# Patient Record
Sex: Male | Born: 1970 | Race: White | Hispanic: No | Marital: Married | Smoking: Former smoker
Health system: Southern US, Community
[De-identification: ages and names within clinical notes are randomized; demographics above are authoritative.]

## PROBLEM LIST (undated history)

## (undated) DIAGNOSIS — S83281A Other tear of lateral meniscus, current injury, right knee, initial encounter: Secondary | ICD-10-CM

## (undated) DIAGNOSIS — Z9289 Personal history of other medical treatment: Secondary | ICD-10-CM

## (undated) DIAGNOSIS — E785 Hyperlipidemia, unspecified: Secondary | ICD-10-CM

## (undated) DIAGNOSIS — G4730 Sleep apnea, unspecified: Secondary | ICD-10-CM

## (undated) DIAGNOSIS — M2341 Loose body in knee, right knee: Secondary | ICD-10-CM

## (undated) HISTORY — DX: Personal history of other medical treatment: Z92.89

## (undated) HISTORY — DX: Hyperlipidemia, unspecified: E78.5

## (undated) HISTORY — PX: NASAL SINUS SURGERY: SHX719

## (undated) HISTORY — PX: LUMBAR DISC SURGERY: SHX700

---

## 1995-08-09 HISTORY — PX: LUMBAR DISC SURGERY: SHX700

## 2001-02-27 ENCOUNTER — Ambulatory Visit (HOSPITAL_COMMUNITY): Admission: RE | Admit: 2001-02-27 | Discharge: 2001-02-27 | Payer: Self-pay | Admitting: Gastroenterology

## 2001-07-16 ENCOUNTER — Emergency Department (HOSPITAL_COMMUNITY): Admission: EM | Admit: 2001-07-16 | Discharge: 2001-07-16 | Payer: Self-pay | Admitting: Emergency Medicine

## 2001-07-16 ENCOUNTER — Encounter: Payer: Self-pay | Admitting: Emergency Medicine

## 2001-07-19 ENCOUNTER — Encounter: Payer: Self-pay | Admitting: Internal Medicine

## 2001-07-19 ENCOUNTER — Encounter: Admission: RE | Admit: 2001-07-19 | Discharge: 2001-07-19 | Payer: Self-pay | Admitting: Internal Medicine

## 2003-04-15 ENCOUNTER — Encounter: Payer: Self-pay | Admitting: *Deleted

## 2003-04-15 ENCOUNTER — Encounter: Admission: RE | Admit: 2003-04-15 | Discharge: 2003-04-15 | Payer: Self-pay | Admitting: *Deleted

## 2003-05-10 ENCOUNTER — Emergency Department (HOSPITAL_COMMUNITY): Admission: EM | Admit: 2003-05-10 | Discharge: 2003-05-11 | Payer: Self-pay | Admitting: Emergency Medicine

## 2005-04-27 ENCOUNTER — Emergency Department (HOSPITAL_COMMUNITY): Admission: EM | Admit: 2005-04-27 | Discharge: 2005-04-27 | Payer: Self-pay | Admitting: Emergency Medicine

## 2006-05-26 ENCOUNTER — Ambulatory Visit (HOSPITAL_BASED_OUTPATIENT_CLINIC_OR_DEPARTMENT_OTHER): Admission: RE | Admit: 2006-05-26 | Discharge: 2006-05-26 | Payer: Self-pay | Admitting: Orthopedic Surgery

## 2006-05-26 HISTORY — PX: KNEE ARTHROSCOPY: SUR90

## 2009-08-08 DIAGNOSIS — R9431 Abnormal electrocardiogram [ECG] [EKG]: Secondary | ICD-10-CM

## 2009-08-08 HISTORY — DX: Abnormal electrocardiogram (ECG) (EKG): R94.31

## 2009-08-28 ENCOUNTER — Encounter: Payer: Self-pay | Admitting: Emergency Medicine

## 2009-08-28 ENCOUNTER — Observation Stay (HOSPITAL_COMMUNITY): Admission: EM | Admit: 2009-08-28 | Discharge: 2009-08-29 | Payer: Self-pay | Admitting: Internal Medicine

## 2009-08-28 ENCOUNTER — Ambulatory Visit: Payer: Self-pay | Admitting: Diagnostic Radiology

## 2009-09-02 ENCOUNTER — Encounter: Admission: RE | Admit: 2009-09-02 | Discharge: 2009-09-02 | Payer: Self-pay | Admitting: Gastroenterology

## 2009-09-04 ENCOUNTER — Encounter: Admission: RE | Admit: 2009-09-04 | Discharge: 2009-09-04 | Payer: Self-pay | Admitting: Gastroenterology

## 2009-12-11 HISTORY — PX: LAPAROSCOPIC CHOLECYSTECTOMY: SUR755

## 2010-02-05 DIAGNOSIS — Z9289 Personal history of other medical treatment: Secondary | ICD-10-CM

## 2010-02-05 HISTORY — DX: Personal history of other medical treatment: Z92.89

## 2010-08-29 ENCOUNTER — Encounter: Payer: Self-pay | Admitting: Gastroenterology

## 2010-10-24 LAB — LIPID PANEL
Cholesterol: 193 mg/dL (ref 0–200)
HDL: 48 mg/dL (ref >39)
LDL Cholesterol: 125 mg/dL — ABNORMAL HIGH (ref 0–99)
Total CHOL/HDL Ratio: 4 RATIO
Triglycerides: 102 mg/dL (ref <150)
VLDL: 20 mg/dL (ref 0–40)

## 2010-10-24 LAB — CARDIAC PANEL(CRET KIN+CKTOT+MB+TROPI)
CK, MB: 1.4 ng/mL (ref 0.3–4.0)
CK, MB: 1.9 ng/mL (ref 0.3–4.0)
Relative Index: 1 (ref 0.0–2.5)
Relative Index: 1.1 (ref 0.0–2.5)
Relative Index: 1.1 (ref 0.0–2.5)
Total CK: 133 U/L (ref 7–232)
Total CK: 146 U/L (ref 7–232)
Total CK: 179 U/L (ref 7–232)
Troponin I: 0.02 ng/mL (ref 0.00–0.06)
Troponin I: 0.02 ng/mL (ref 0.00–0.06)
Troponin I: 0.02 ng/mL (ref 0.00–0.06)

## 2010-10-24 LAB — HEMOGLOBIN A1C
Hgb A1c MFr Bld: 5.6 % (ref 4.6–6.1)
Mean Plasma Glucose: 114 mg/dL

## 2010-10-24 LAB — CBC
Platelets: 219 10*3/uL (ref 150–400)
WBC: 10.3 10*3/uL (ref 4.0–10.5)

## 2010-10-24 LAB — BASIC METABOLIC PANEL
BUN: 13 mg/dL (ref 6–23)
Chloride: 105 mEq/L (ref 96–112)
Potassium: 3.4 mEq/L — ABNORMAL LOW (ref 3.5–5.1)

## 2010-10-24 LAB — TSH: TSH: 0.652 u[IU]/mL (ref 0.350–4.500)

## 2010-10-25 LAB — POCT CARDIAC MARKERS
CKMB, poc: 1.3 ng/mL (ref 1.0–8.0)
Myoglobin, poc: 57.7 ng/mL (ref 12–200)

## 2010-10-25 LAB — DIFFERENTIAL
Basophils Absolute: 0.1 10*3/uL (ref 0.0–0.1)
Lymphocytes Relative: 23 % (ref 12–46)
Monocytes Absolute: 0.7 10*3/uL (ref 0.1–1.0)
Monocytes Relative: 8 % (ref 3–12)
Neutro Abs: 6.1 10*3/uL (ref 1.7–7.7)
Neutrophils Relative %: 68 % (ref 43–77)

## 2010-10-25 LAB — POCT TOXICOLOGY PANEL

## 2010-10-25 LAB — CBC
Hemoglobin: 16.1 g/dL (ref 13.0–17.0)
RBC: 5.46 MIL/uL (ref 4.22–5.81)
RDW: 13.1 % (ref 11.5–15.5)

## 2010-10-25 LAB — BASIC METABOLIC PANEL
Calcium: 9.4 mg/dL (ref 8.4–10.5)
GFR calc Af Amer: >60 mL/min (ref >60)
GFR calc non Af Amer: >60 mL/min (ref >60)
Sodium: 143 mEq/L (ref 135–145)

## 2010-12-24 NOTE — Op Note (Signed)
NAMEVONDELL, Philip Dorsey              ACCOUNT NO.:  [id number]   MEDICAL RECORD NO.:  [id number]          PATIENT TYPE:  AMB   LOCATION:  [facility]                         FACILITY:  [facility]   PHYSICIAN:  Marlowe Kays, M.D.  DATE OF BIRTH:  [birth year]   DATE OF PROCEDURE:  05/26/2006  DATE OF DISCHARGE:  05/26/2006                                 OPERATIVE REPORT   PREOPERATIVE DIAGNOSIS:  Torn medial meniscus left knee.   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   OPERATION:  Left knee arthroscopy with excision of medial shelf plica and  debridement of medial facet of patella.   SURGEON:  Marlowe Kays, M.D.   ASSISTANT:  None.   ANESTHESIA:  General.   PATHOLOGY AND INDICATIONS FOR PROCEDURE:  This man has had a many-year  history of pain the inner aspect of his left knee.  An MRI in 2005 had  demonstrated what appeared to be a posterior horn tear of the medial  meniscus.  This was equivocal.  No other abnormalities were noted.  He has  noted progressive pain and popping in the knee and difficulty getting up  from a sitting position when playing with his daughter.  This has prompted  his surgery here today.  See operative description below for additional  details.   PROCEDURE:  Under satisfactory general anesthesia and pneumatic tourniquet,  the left leg was esmarched out nonsterilely.  Ace wrap and knee support to  right leg.  Thigh stabilizer applied to the left leg, which was prepped from  stabilizer to ankle with DuraPrep and draped into sterile field.  Superomedial saline inflow.  First through an anterolateral portal, the  medial compartment of the knee joint was evaluated and looked absolutely  normal.  There was no unusual synovitis, no chondromalacia, and I got a good  look at the entire medial meniscus with representative pictures being taken.  No abnormality could be noted on probing.  I then looked up in  the medial  gutter and suprapatellar area.  He had a large medial shelf plica which was  causing some irritation of the medial femoral condyle but particularly the  medial facet of the patella with some wear present.  This was in contrast to  the remainder of the patella which looked completely normal.  I was able to  completely resect the plica with a 3.5 shaver including some smoothing down  of the medial femoral condyle medially and the medial facet of the patella.  Pre and post films were taken.  I then reversed the  portals.  The lateral  compartment of the knee joint was completely normal once again.  Representative picture was taken.  The knee joint was then irrigated until  clear and all fluid possible removed.  The two anterior portals were closed  with 4-0 nylon.  I injected 20 mL of 0.5% Marcaine with adrenaline and 4 mg  of morphine into the knee through the inflow apparatus which was removed and  this portal  closed with 4-0 nylon as well.  Betadine  and Adaptic dry sterile dressing  were applied.  Tourniquet was released.  He tolerated the procedure well and  was taken to the recovery room in satisfactory condition with no known  complications.           ______________________________  Marlowe Kays, M.D.     JA/MEDQ  D:  05/26/2006  T:  05/28/2006  Job:  [id number]

## 2010-12-24 NOTE — Procedures (Signed)
[facility]  Patient:    Philip Dorsey, Philip Dorsey                       MRN: [id number] Proc. Date: 02/27/01 Attending:  Verlin Grills, M.D.                           Procedure Report  PROCEDURE PERFORMED:  Esophagogastroduodenoscopy.  DATE OF BIRTH:  May 08, 1971  ENDOSCOPIST:  Verlin Grills, M.D.  INDICATIONS FOR PROCEDURE:  The patient is a 40 year old male.  Philip Dorsey has had both daytime and nocturnal heartburn unassociated with dysphagia, odynophagia or gastrointestinal bleeding for approximately two months.  His heartburn intensified about the time he quit smoking cigarettes and chewing tobacco.  Despite double dose Nexium and then a trial of prevacid, his heartburn persisted.  He takes no other medications.  He is otherwise in good health.  He tells me he did undergo a cardiac evaluation following an episode of intense chest pain and was told his heart was normal.  CURRENT MEDICATIONS:  Prevacid.  MEDICATION ALLERGIES:  None.  PAST MEDICAL HISTORY:  Back surgery in 1997.  PREMEDICATION:  Versed 10 mg, Demerol 50 mg.  INSTRUMENT USED:  Olympus gastroscope.  DESCRIPTION OF PROCEDURE:  After obtaining informed consent, Philip Dorsey was placed in a left lateral decubitus position.  I administered intravenous Versed and intravenous Demerol to achieve conscious sedation for the procedure.  The patients blood pressure, oxygen saturation and cardiac rhythm were monitored throughout the procedure and documented in the medical record.   The Olympus gastroscope was passed through the posterior hypopharynx into the proximal esophagus without difficulty.  The hypopharynx, larynx and vocal cords appeared normal.  Esophagoscopy:  The proximal, mid and lower segments of the esophagus appeared normal.  The squamocolumnar junction and the esophagogastric junction were noted at approximately 45 cm from the incisor teeth.  Endoscopically,  there is no evidence for the presence of Barretts esophagus, esophageal mucosal scarring, erosive esophagitis or esophageal ulceration.  Gastroscopy:  A retroflex view of the gastric cardia and fundus was normal. The gastric body, antrum and pylorus appeared normal.  Duodenoscopy:  The duodenal bulb and descending duodenum appeared normal.  ASSESSMENT:  Normal esophagogastroduodenoscopy. DD:  02/27/01 TD:  02/27/01 Job: [id number]

## 2011-11-30 ENCOUNTER — Encounter (HOSPITAL_BASED_OUTPATIENT_CLINIC_OR_DEPARTMENT_OTHER): Payer: Self-pay | Admitting: *Deleted

## 2011-11-30 NOTE — Progress Notes (Signed)
NPO AFTER MN WITH EXCEPTION WATER/ GATORADE UNTIL 0700. ARRIVES AT 1100. NEEDS HG. 

## 2011-12-01 ENCOUNTER — Other Ambulatory Visit: Payer: Self-pay | Admitting: Orthopedic Surgery

## 2011-12-02 ENCOUNTER — Encounter (HOSPITAL_BASED_OUTPATIENT_CLINIC_OR_DEPARTMENT_OTHER): Payer: Self-pay | Admitting: *Deleted

## 2011-12-02 ENCOUNTER — Encounter (HOSPITAL_BASED_OUTPATIENT_CLINIC_OR_DEPARTMENT_OTHER): Payer: Self-pay | Admitting: Anesthesiology

## 2011-12-02 ENCOUNTER — Encounter (HOSPITAL_BASED_OUTPATIENT_CLINIC_OR_DEPARTMENT_OTHER)
Admission: RE | Disposition: A | Discharge status: Home / Self Care | Payer: Self-pay | Source: Ambulatory Visit | Attending: Orthopedic Surgery

## 2011-12-02 ENCOUNTER — Ambulatory Visit (HOSPITAL_BASED_OUTPATIENT_CLINIC_OR_DEPARTMENT_OTHER)
Admission: RE | Admit: 2011-12-02 | Discharge: 2011-12-02 | Disposition: A | Discharge status: Home / Self Care | Payer: 59 | Source: Ambulatory Visit | Attending: Orthopedic Surgery | Admitting: Orthopedic Surgery

## 2011-12-02 ENCOUNTER — Ambulatory Visit (HOSPITAL_BASED_OUTPATIENT_CLINIC_OR_DEPARTMENT_OTHER): Payer: 59 | Admitting: Anesthesiology

## 2011-12-02 DIAGNOSIS — M2240 Chondromalacia patellae, unspecified knee: Secondary | ICD-10-CM | POA: Insufficient documentation

## 2011-12-02 DIAGNOSIS — Z9889 Other specified postprocedural states: Secondary | ICD-10-CM

## 2011-12-02 DIAGNOSIS — M238X9 Other internal derangements of unspecified knee: Secondary | ICD-10-CM | POA: Insufficient documentation

## 2011-12-02 HISTORY — DX: Loose body in knee, right knee: M23.41

## 2011-12-02 HISTORY — PX: KNEE ARTHROSCOPY: SHX127

## 2011-12-02 LAB — POCT HEMOGLOBIN-HEMACUE: Hemoglobin: 14.5 g/dL (ref 13.0–17.0)

## 2011-12-02 SURGERY — ARTHROSCOPY, KNEE
Anesthesia: General | Site: Knee | Laterality: Right | Wound class: Clean

## 2011-12-02 MED ORDER — FENTANYL CITRATE 0.05 MG/ML IJ SOLN
25.0000 ug | INTRAMUSCULAR | Status: DC | PRN
  Administered 2011-12-02 (×2): 50 ug via INTRAVENOUS

## 2011-12-02 MED ORDER — SODIUM CHLORIDE 0.9 % IR SOLN
Status: DC | PRN
  Administered 2011-12-02: 9000 mL

## 2011-12-02 MED ORDER — OXYCODONE HCL 5 MG PO TABS
2.5000 mg | ORAL_TABLET | Freq: Once | ORAL | Status: AC
  Administered 2011-12-02: 5 mg via ORAL

## 2011-12-02 MED ORDER — POVIDONE-IODINE 7.5 % EX SOLN: Freq: Once | CUTANEOUS | Status: DC

## 2011-12-02 MED ORDER — ONDANSETRON HCL 4 MG/2ML IJ SOLN
INTRAMUSCULAR | Status: DC | PRN
  Administered 2011-12-02: 4 mg via INTRAVENOUS

## 2011-12-02 MED ORDER — OXYCODONE-ACETAMINOPHEN 5-325 MG PO TABS
1.0000 | ORAL_TABLET | Freq: Once | ORAL | Status: AC
  Administered 2011-12-02: 1 via ORAL

## 2011-12-02 MED ORDER — EPINEPHRINE HCL 1 MG/ML IJ SOLN
INTRAMUSCULAR | Status: DC | PRN
  Administered 2011-12-02: 2 mg

## 2011-12-02 MED ORDER — BUPIVACAINE-EPINEPHRINE 0.5% -1:200000 IJ SOLN
INTRAMUSCULAR | Status: DC | PRN
  Administered 2011-12-02: 25 mL

## 2011-12-02 MED ORDER — MIDAZOLAM HCL 5 MG/5ML IJ SOLN
INTRAMUSCULAR | Status: DC | PRN
  Administered 2011-12-02: 2 mg via INTRAVENOUS

## 2011-12-02 MED ORDER — DEXAMETHASONE SODIUM PHOSPHATE 4 MG/ML IJ SOLN
INTRAMUSCULAR | Status: DC | PRN
  Administered 2011-12-02: 8 mg via INTRAVENOUS

## 2011-12-02 MED ORDER — METHOCARBAMOL 500 MG PO TABS: 500.0000 mg | ORAL_TABLET | Freq: Four times a day (QID) | ORAL | Status: AC

## 2011-12-02 MED ORDER — PROPOFOL 10 MG/ML IV EMUL
INTRAVENOUS | Status: DC | PRN
  Administered 2011-12-02: 200 mg via INTRAVENOUS

## 2011-12-02 MED ORDER — MORPHINE SULFATE 4 MG/ML IJ SOLN
INTRAMUSCULAR | Status: DC | PRN
  Administered 2011-12-02: 4 mg via INTRAVENOUS

## 2011-12-02 MED ORDER — LACTATED RINGERS IV SOLN
INTRAVENOUS | Status: DC
  Administered 2011-12-02 (×2): via INTRAVENOUS

## 2011-12-02 MED ORDER — LACTATED RINGERS IV SOLN: INTRAVENOUS | Status: DC

## 2011-12-02 MED ORDER — LIDOCAINE HCL (CARDIAC) 20 MG/ML IV SOLN
INTRAVENOUS | Status: DC | PRN
  Administered 2011-12-02: 60 mg via INTRAVENOUS

## 2011-12-02 MED ORDER — PROMETHAZINE HCL 25 MG/ML IJ SOLN: 6.2500 mg | INTRAMUSCULAR | Status: DC | PRN

## 2011-12-02 MED ORDER — OXYCODONE-ACETAMINOPHEN 7.5-325 MG PO TABS: 1.0000 | ORAL_TABLET | ORAL | Status: AC | PRN

## 2011-12-02 MED ORDER — FENTANYL CITRATE 0.05 MG/ML IJ SOLN
INTRAMUSCULAR | Status: DC | PRN
  Administered 2011-12-02: 50 ug via INTRAVENOUS
  Administered 2011-12-02: 25 ug via INTRAVENOUS
  Administered 2011-12-02: 50 ug via INTRAVENOUS
  Administered 2011-12-02: 25 ug via INTRAVENOUS
  Administered 2011-12-02: 50 ug via INTRAVENOUS

## 2011-12-02 SURGICAL SUPPLY — 47 items
BANDAGE ELASTIC 6 VELCRO ST LF (GAUZE/BANDAGES/DRESSINGS) ×2 IMPLANT
BANDAGE ESMARK 6X9 LF (GAUZE/BANDAGES/DRESSINGS) ×1 IMPLANT
BANDAGE GAUZE ELAST BULKY 4 IN (GAUZE/BANDAGES/DRESSINGS) ×2 IMPLANT
BLADE 4.2CUDA (BLADE) IMPLANT
BLADE CUDA 5.5 (BLADE) IMPLANT
BLADE CUDA SHAVER 3.5 (BLADE) ×2 IMPLANT
BLADE CUTTER GATOR 3.5 (BLADE) IMPLANT
BLADE GREAT WHITE 4.2 (BLADE) IMPLANT
BNDG ESMARK 6X9 LF (GAUZE/BANDAGES/DRESSINGS) ×2
CANISTER SUCT LVC 12 LTR MEDI- (MISCELLANEOUS) ×4 IMPLANT
CANISTER SUCTION 1200CC (MISCELLANEOUS) ×2 IMPLANT
CLOTH BEACON ORANGE TIMEOUT ST (SAFETY) ×2 IMPLANT
DRAPE ARTHROSCOPY W/POUCH 114 (DRAPES) ×2 IMPLANT
DRAPE LG THREE QUARTER DISP (DRAPES) ×2 IMPLANT
DRSG EMULSION OIL 3X3 NADH (GAUZE/BANDAGES/DRESSINGS) ×2 IMPLANT
DRSG PAD ABDOMINAL 8X10 ST (GAUZE/BANDAGES/DRESSINGS) ×2 IMPLANT
DURAPREP 26ML APPLICATOR (WOUND CARE) ×2 IMPLANT
ELECT MENISCUS 165MM 90D (ELECTRODE) IMPLANT
ELECT REM PT RETURN 9FT ADLT (ELECTROSURGICAL)
ELECTRODE REM PT RTRN 9FT ADLT (ELECTROSURGICAL) IMPLANT
GLOVE BIOGEL PI IND STRL 8 (GLOVE) ×1 IMPLANT
GLOVE BIOGEL PI INDICATOR 8 (GLOVE) ×1
GLOVE ECLIPSE 8.0 STRL XLNG CF (GLOVE) ×4 IMPLANT
GLOVE INDICATOR 8.0 STRL GRN (GLOVE) ×4 IMPLANT
GOWN PREVENTION PLUS LG XLONG (DISPOSABLE) ×2 IMPLANT
GOWN STRL REIN XL XLG (GOWN DISPOSABLE) ×2 IMPLANT
IV NS IRRIG 3000ML ARTHROMATIC (IV SOLUTION) ×4 IMPLANT
KNEE WRAP E Z 3 GEL PACK (MISCELLANEOUS) ×2 IMPLANT
NDL SAFETY ECLIPSE 18X1.5 (NEEDLE) ×1 IMPLANT
NEEDLE HYPO 18GX1.5 BLUNT FILL (NEEDLE) ×2 IMPLANT
NEEDLE HYPO 18GX1.5 SHARP (NEEDLE) ×1
PACK ARTHROSCOPY DSU (CUSTOM PROCEDURE TRAY) ×2 IMPLANT
PACK BASIN DAY SURGERY FS (CUSTOM PROCEDURE TRAY) ×2 IMPLANT
PADDING CAST ABS 4INX4YD NS (CAST SUPPLIES) ×1
PADDING CAST ABS COTTON 4X4 ST (CAST SUPPLIES) ×1 IMPLANT
PENCIL BUTTON HOLSTER BLD 10FT (ELECTRODE) IMPLANT
SET ARTHROSCOPY TUBING (MISCELLANEOUS) ×2
SET ARTHROSCOPY TUBING LN (MISCELLANEOUS) ×2 IMPLANT
SPONGE GAUZE 4X4 12PLY (GAUZE/BANDAGES/DRESSINGS) ×2 IMPLANT
SUT ETHIBOND 2 OS 4 DA (SUTURE) IMPLANT
SUT ETHILON 4 0 PS 2 18 (SUTURE) ×2 IMPLANT
SUT VIC AB 0 CT1 36 (SUTURE) IMPLANT
SUT VIC AB 2-0 PS2 27 (SUTURE) IMPLANT
SYRINGE 10CC LL (SYRINGE) ×2 IMPLANT
TOWEL OR 17X24 6PK STRL BLUE (TOWEL DISPOSABLE) ×2 IMPLANT
WAND 90 DEG TURBOVAC W/CORD (SURGICAL WAND) IMPLANT
WATER STERILE IRR 500ML POUR (IV SOLUTION) ×2 IMPLANT

## 2011-12-02 NOTE — Anesthesia Postprocedure Evaluation (Signed)
  Anesthesia Post-op Note  Patient: Philip Dorsey  Procedure(s) Performed: Procedure(s) (LRB): ARTHROSCOPY KNEE (Right)  Patient Location: PACU  Anesthesia Type: General  Level of Consciousness: awake and alert   Airway and Oxygen Therapy: Patient Spontanous Breathing  Post-op Pain: mild  Post-op Assessment: Post-op Vital signs reviewed, Patient's Cardiovascular Status Stable, Respiratory Function Stable, Patent Airway and No signs of Nausea or vomiting  Post-op Vital Signs: stable  Complications: No apparent anesthesia complications

## 2011-12-02 NOTE — Discharge Instructions (Signed)
  Post Anesthesia Home Care Instructions  Activity: Get plenty of rest for the remainder of the day. A responsible adult should stay with you for 24 hours following the procedure.  For the next 24 hours, DO NOT: -Drive a car -Operate machinery -Drink alcoholic beverages -Take any medication unless instructed by your physician -Make any legal decisions or sign important papers.  Meals: Start with liquid foods such as gelatin or soup. Progress to regular foods as tolerated. Avoid greasy, spicy, heavy foods. If nausea and/or vomiting occur, drink only clear liquids until the nausea and/or vomiting subsides. Call your physician if vomiting continues.  Special Instructions/Symptoms: Your throat may feel dry or sore from the anesthesia or the breathing tube placed in your throat during surgery. If this causes discomfort, gargle with warm salt water. The discomfort should disappear within 24 hours.  Discharge Instructions After Orthopedic Procedures:  *You may feel tired and weak following your procedure. It is recommended that you limit physical activity for the next 24 hours and rest at home for the remainder of today and tomorrow. *No strenuous activity should be started without your doctor's permission.  Elevate the extremity that you had surgery on to a level above your heart. This should continue for 48 hours or as instructed by your doctor.  If you had hand, arm or shoulder surgery you should move your fingers frequently unless otherwise instructed by your doctor.  If you had foot, knee or leg surgery you should wiggle your toes frequently unless otherwise instructed by your doctor.  Follow your doctor's exact instructions for activity at home. Use your home equipment as instructed. (Crutches, hard shoes, slings etc.)  Limit your activity as instructed by your doctor.  Report to your doctor should any of the following occur: 1. Extreme swelling of your fingers or toes. 2. Inability  to wiggle your fingers or toes. 3. Coldness, pale or bluish color in your fingers or toes. 4. Loss of sensation, numbness or tingling of your fingers or toes. 5. Unusual smell or odor from under your dressing or cast. 6. Excessive bleeding or drainage from the surgical site. 7. Pain not relieved by medication your doctor has prescribed for you. 8. Cast or dressing too tight (do not get your dressing or cast wet or put anything under          your dressing or cast.)  *Do not change your dressing unless instructed by your doctor or discharge nurse. Then follow exact instructions.  *Follow labeled instructions for any medications that your doctor may have prescribed for you. *Should any questions or complications develop following your procedure, PLEASE CONTACT YOUR DOCTOR.      

## 2011-12-02 NOTE — Anesthesia Preprocedure Evaluation (Addendum)
Anesthesia Evaluation  Patient identified by MRN, date of birth, ID band Patient awake    Reviewed: Allergy & Precautions, H&P , NPO status , Patient's Chart, lab work & pertinent test results  Airway Mallampati: I TM Distance: >3 FB Neck ROM: Full    Dental  (+) Teeth Intact, Dental Advisory Given and Caps,    Pulmonary neg pulmonary ROS,  breath sounds clear to auscultation  Pulmonary exam normal       Cardiovascular negative cardio ROS  Rhythm:Regular Rate:Normal     Neuro/Psych negative neurological ROS  negative psych ROS   GI/Hepatic negative GI ROS, Neg liver ROS,   Endo/Other  negative endocrine ROS  Renal/GU negative Renal ROS  negative genitourinary   Musculoskeletal negative musculoskeletal ROS (+)   Abdominal   Peds  Hematology negative hematology ROS (+)   Anesthesia Other Findings   Reproductive/Obstetrics negative OB ROS                          Anesthesia Physical Anesthesia Plan  ASA: I  Anesthesia Plan: General   Post-op Pain Management:    Induction: Intravenous  Airway Management Planned: LMA  Additional Equipment:   Intra-op Plan:   Post-operative Plan: Extubation in OR  Informed Consent: I have reviewed the patients History and Physical, chart, labs and discussed the procedure including the risks, benefits and alternatives for the proposed anesthesia with the patient or authorized representative who has indicated his/her understanding and acceptance.   Dental advisory given  Plan Discussed with: CRNA  Anesthesia Plan Comments:         Anesthesia Quick Evaluation

## 2011-12-02 NOTE — Brief Op Note (Signed)
12/02/2011  3:09 PM  PATIENT:  Philip Dorsey  41 y.o. 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  PROCEDURE:  Procedure(s) (LRB): ARTHROSCOPY KNEE (Right)wit debridement medial facet patella; synovectomy anterior medial joint  SURGEON:  Surgeon(s) and Role:    * Drucilla Schmidt, MD - Primary  PHYSICIAN ASSISTANT:    ASSISTANTS:nurse ANESTHESIA:   general  EBL:  Total I/O In: 1300 [I.V.:1300] Out: -   BLOOD ADMINISTERED:none  DRAINS: none   LOCAL MEDICATIONS USED:  MARCAINE     SPECIMEN:  No Specimen  DISPOSITION OF SPECIMEN:  N/A  COUNTS:  YES  TOURNIQUET:  * Missing tourniquet times found for documented tourniquets in log:  [id number] *  DICTATION: .Other Dictation: Dictation Number [id number]  PLAN OF CARE: Discharge to home after PACU  PATIENT DISPOSITION:  PACU - hemodynamically stable.   Delay start of Pharmacological VTE agent (>24hrs) due to surgical blood loss or risk of bleeding: not applicable

## 2011-12-02 NOTE — Anesthesia Procedure Notes (Signed)
Procedure Name: LMA Insertion Date/Time: 12/02/2011 1:35 PM Performed by: Renella Cunas D Pre-anesthesia Checklist: Patient identified, Emergency Drugs available, Suction available and Patient being monitored Patient Re-evaluated:Patient Re-evaluated prior to inductionOxygen Delivery Method: Circle System Utilized Preoxygenation: Pre-oxygenation with 100% oxygen Intubation Type: IV induction Ventilation: Mask ventilation without difficulty LMA: LMA inserted LMA Size: 4.0 Number of attempts: 1 Airway Equipment and Method: bite block Placement Confirmation: positive ETCO2 Tube secured with: Tape Dental Injury: Teeth and Oropharynx as per pre-operative assessment

## 2011-12-02 NOTE — Transfer of Care (Signed)
Immediate Anesthesia Transfer of Care Note  Patient: Philip Dorsey  Procedure(s) Performed: Procedure(s) (LRB): ARTHROSCOPY KNEE (Right)  Patient Location: PACU  Anesthesia Type: General  Level of Consciousness: awake, oriented, sedated and patient cooperative  Airway & Oxygen Therapy: Patient Spontanous Breathing and Patient connected to face mask oxygen  Post-op Assessment: Report given to PACU RN and Post -op Vital signs reviewed and stable  Post vital signs: Reviewed and stable  Complications: No apparent anesthesia complications

## 2011-12-02 NOTE — H&P (Signed)
Philip Dorsey is an 41 y.o. male.   Chief Complaint: painful rt knee NWG:NFAOZHY of successful lt knee arthroscopy;now rt knee pain with loose body on xray  Past Medical History  Diagnosis Date  . Loose body of right knee     Past Surgical History  Procedure Date  . Knee arthroscopy 05-26-2006    LEFT  . Lumbar disc surgery 1997  . Laparoscopic cholecystectomy 2010    History reviewed. No pertinent family history. Social History:  reports that he has never smoked. He has never used smokeless tobacco. He reports that he drinks alcohol. He reports that he does not use illicit drugs.  Allergies: No Known Allergies  No prescriptions prior to admission    No results found for this or any previous visit (from the past 48 hour(s)). No results found.  ROS  Blood pressure 119/72, pulse 61, temperature 97.6 F (36.4 C), temperature source Oral, resp. rate 20, height 5\' 11"  (1.803 m), weight 90.719 kg (200 lb), SpO2 98.00%. Physical Exam  Constitutional: He is oriented to person, place, and time. He appears well-developed and well-nourished.  HENT:  Head: Normocephalic and atraumatic.  Right Ear: External ear normal.  Left Ear: External ear normal.  Nose: Nose normal.  Mouth/Throat: Oropharynx is clear and moist.  Eyes: Conjunctivae and EOM are normal. Pupils are equal, round, and reactive to light.  Neck: Normal range of motion. Neck supple.  Cardiovascular: Normal rate, regular rhythm, normal heart sounds and intact distal pulses.   Respiratory: Effort normal and breath sounds normal.  GI: Soft. Bowel sounds are normal.  Musculoskeletal: Normal range of motion.  Neurological: He is alert and oriented to person, place, and time. He has normal reflexes.  Skin: Skin is warm and dry.  Psychiatric: He has a normal mood and affect. His behavior is normal. Judgment and thought content normal.     Assessment/Plan Painful rt knee with loose body Rt knee arthroscopy with removal  loose body  Philip Dorsey P 12/02/2011, 1:21 PM

## 2011-12-04 NOTE — Op Note (Signed)
NAMEPELHAM, HENNICK              ACCOUNT NO.:  [id number]  MEDICAL RECORD NO.:  [id number]  LOCATION:                               FACILITY:  [facility]  PHYSICIAN:  Marlowe Kays, M.D.  DATE OF BIRTH:  1971/02/23  DATE OF PROCEDURE: DATE OF DISCHARGE:                              OPERATIVE REPORT   PREOPERATIVE DIAGNOSIS:  Painful right knee with suspected loose body.  POSTOPERATIVE DIAGNOSES: 1. Chondromalacia of medial facet of patella. 2. 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  OPERATION: 1. Right knee arthroscopy with 1 debridement of medial facet of     patella. 2. Debridement of synovium, anterior medial joint.  SURGEON:  Marlowe Kays, M.D.  ASSISTANT:  Nurse.  ANESTHESIA:  General.  PATHOLOGY AND JUSTIFICATION FOR PROCEDURE:  Painful right knee with what appeared to be a loose body in the anterior joint on plain x-rays.  He has had previous left knee surgery with the plica and with excision of it, did well.  I did not get an MRI of his right knee because of the anticipated pathology and what appeared to be a loose body on plain x- rays.  PROCEDURE IN DETAIL:  Satisfied general anesthesia, Ace wrap, and knee support to the left lower extremity, pneumatic tourniquet applied to the right lower extremity and with the leg Esmarched out nonsterilely, the tourniquet inflated to 300 mmHg.  Superior medial saline inflow.  First, an anterolateral portal, medial compartment joint was evaluated, found a very thickened synovium in the anterior medial joint, which was clearly abnormal and associated anatomically with wearing of the medial femoral condyle articular cartilage.  I felt__________ this could have accounted for a small fragment of tissue that could be seen on x-ray.  I debrided down the anterior joint and the process cleaned off the meniscus.  The synovium appeared to be adherent to the  meniscus.  The remainder of the meniscus was normal on probing except for a very minimal area of fraying _____at_____ about the junction of the mid and posterior 3rd, which I felt that I would create more alteration to the tissue by trying to shave this out since it did not appear to be a harmful type of fraying. Looking at the medial gutter and suprapatellar area, I did not find the medial shelf plica, but did find significant wear along with large strands of articular cartilage coming off the medial facet of the patella, which I shaved down roughly 3/4 chondromalacia present.  On reversing portals, his ACL looked normal, it has minimal synovial projections in the anterior 3rd of the lateral joint, which I pictured and resected.  The remainder of the joint looking normal.  I _then searched_________ the joint for any fragments and found none.  Knee joint was then irrigated till clear.  I closed the 2 anterior portals with 4-0 nylon and injected through the superior medial portal, 20 cc of 0.25% Marcaine and 4 mg of morphine. Then, closed this portal with 4-0 nylon as well.  Betadine, Adaptic, dry sterile dressing were applied.  Tourniquet was released.  She tolerated the procedure well, was taken  to recovery room in satisfied condition with no known complications.          ______________________________ Marlowe Kays, M.D.     JA/MEDQ  D:  12/02/2011  T:  12/03/2011  Job:  [id number]

## 2011-12-05 ENCOUNTER — Encounter (HOSPITAL_BASED_OUTPATIENT_CLINIC_OR_DEPARTMENT_OTHER): Payer: Self-pay | Admitting: Orthopedic Surgery

## 2011-12-08 ENCOUNTER — Encounter (HOSPITAL_BASED_OUTPATIENT_CLINIC_OR_DEPARTMENT_OTHER): Payer: Self-pay

## 2013-06-18 ENCOUNTER — Encounter: Payer: Self-pay | Admitting: Internal Medicine

## 2013-06-18 ENCOUNTER — Encounter: Payer: Self-pay | Admitting: *Deleted

## 2013-06-19 ENCOUNTER — Ambulatory Visit: Payer: 59 | Admitting: Internal Medicine

## 2014-02-18 DIAGNOSIS — K219 Gastro-esophageal reflux disease without esophagitis: Secondary | ICD-10-CM | POA: Insufficient documentation

## 2014-02-18 DIAGNOSIS — K5790 Diverticulosis of intestine, part unspecified, without perforation or abscess without bleeding: Secondary | ICD-10-CM | POA: Insufficient documentation

## 2015-08-04 ENCOUNTER — Other Ambulatory Visit: Payer: Self-pay | Admitting: Otolaryngology

## 2015-08-04 DIAGNOSIS — H9190 Unspecified hearing loss, unspecified ear: Secondary | ICD-10-CM

## 2015-08-04 DIAGNOSIS — H9312 Tinnitus, left ear: Secondary | ICD-10-CM

## 2015-08-04 DIAGNOSIS — H6980 Other specified disorders of Eustachian tube, unspecified ear: Secondary | ICD-10-CM

## 2015-08-06 ENCOUNTER — Ambulatory Visit
Admission: RE | Admit: 2015-08-06 | Discharge: 2015-08-06 | Disposition: A | Discharge status: Home / Self Care | Payer: BLUE CROSS/BLUE SHIELD | Source: Ambulatory Visit | Attending: Otolaryngology | Admitting: Otolaryngology

## 2015-08-06 DIAGNOSIS — H9312 Tinnitus, left ear: Secondary | ICD-10-CM

## 2015-08-06 DIAGNOSIS — H9190 Unspecified hearing loss, unspecified ear: Secondary | ICD-10-CM

## 2015-08-06 DIAGNOSIS — H6980 Other specified disorders of Eustachian tube, unspecified ear: Secondary | ICD-10-CM

## 2015-08-06 MED ORDER — GADOBENATE DIMEGLUMINE 529 MG/ML IV SOLN
18.0000 mL | Freq: Once | INTRAVENOUS | Status: AC | PRN
  Administered 2015-08-06: 18 mL via INTRAVENOUS

## 2015-08-21 ENCOUNTER — Other Ambulatory Visit: Payer: Self-pay | Admitting: Otolaryngology

## 2016-08-08 HISTORY — PX: BACK SURGERY: SHX140

## 2016-10-26 DIAGNOSIS — M5136 Other intervertebral disc degeneration, lumbar region: Secondary | ICD-10-CM | POA: Insufficient documentation

## 2016-10-26 DIAGNOSIS — M545 Low back pain, unspecified: Secondary | ICD-10-CM | POA: Insufficient documentation

## 2016-10-26 DIAGNOSIS — M5137 Other intervertebral disc degeneration, lumbosacral region: Secondary | ICD-10-CM | POA: Insufficient documentation

## 2016-11-30 DIAGNOSIS — Z9889 Other specified postprocedural states: Secondary | ICD-10-CM | POA: Insufficient documentation

## 2018-06-02 ENCOUNTER — Encounter: Payer: Self-pay | Admitting: Podiatry

## 2018-06-02 ENCOUNTER — Ambulatory Visit (INDEPENDENT_AMBULATORY_CARE_PROVIDER_SITE_OTHER): Payer: Commercial Managed Care - PPO

## 2018-06-02 ENCOUNTER — Ambulatory Visit: Payer: Commercial Managed Care - PPO | Admitting: Podiatry

## 2018-06-02 VITALS — BP 130/87 | HR 62

## 2018-06-02 DIAGNOSIS — M7661 Achilles tendinitis, right leg: Secondary | ICD-10-CM

## 2018-06-02 DIAGNOSIS — M76822 Posterior tibial tendinitis, left leg: Secondary | ICD-10-CM

## 2018-06-02 DIAGNOSIS — M7752 Other enthesopathy of left foot: Secondary | ICD-10-CM | POA: Diagnosis not present

## 2018-06-02 DIAGNOSIS — M659 Unspecified synovitis and tenosynovitis, unspecified site: Secondary | ICD-10-CM

## 2018-06-02 DIAGNOSIS — M7671 Peroneal tendinitis, right leg: Secondary | ICD-10-CM

## 2018-06-02 DIAGNOSIS — M7751 Other enthesopathy of right foot: Secondary | ICD-10-CM

## 2018-06-02 DIAGNOSIS — M7750 Other enthesopathy of unspecified foot: Secondary | ICD-10-CM

## 2018-06-02 MED ORDER — METHYLPREDNISOLONE 4 MG PO TBPK: ORAL_TABLET | ORAL | 0 refills | Status: DC

## 2018-06-02 NOTE — Patient Instructions (Signed)
Achilles Tendinitis  with Rehab Achilles tendinitis is a disorder of the Achilles tendon. The Achilles tendon connects the large calf muscles (Gastrocnemius and Soleus) to the heel bone (calcaneus). This tendon is sometimes called the heel cord. It is important for pushing-off and standing on your toes and is important for walking, running, or jumping. Tendinitis is often caused by overuse and repetitive microtrauma. SYMPTOMS  Pain, tenderness, swelling, warmth, and redness may occur over the Achilles tendon even at rest.  Pain with pushing off, or flexing or extending the ankle.  Pain that is worsened after or during activity. CAUSES   Overuse sometimes seen with rapid increase in exercise programs or in sports requiring running and jumping.  Poor physical conditioning (strength and flexibility or endurance).  Running sports, especially training running down hills.  Inadequate warm-up before practice or play or failure to stretch before participation.  Injury to the tendon. PREVENTION   Warm up and stretch before practice or competition.  Allow time for adequate rest and recovery between practices and competition.  Keep up conditioning.  Keep up ankle and leg flexibility.  Improve or keep muscle strength and endurance.  Improve cardiovascular fitness.  Use proper technique.  Use proper equipment (shoes, skates).  To help prevent recurrence, taping, protective strapping, or an adhesive bandage may be recommended for several weeks after healing is complete. PROGNOSIS   Recovery may take weeks to several months to heal.  Longer recovery is expected if symptoms have been prolonged.  Recovery is usually quicker if the inflammation is due to a direct blow as compared with overuse or sudden strain. RELATED COMPLICATIONS   Healing time will be prolonged if the condition is not correctly treated. The injury must be given plenty of time to heal.  Symptoms can reoccur if  activity is resumed too soon.  Untreated, tendinitis may increase the risk of tendon rupture requiring additional time for recovery and possibly surgery. TREATMENT   The first treatment consists of rest anti-inflammatory medication, and ice to relieve the pain.  Stretching and strengthening exercises after resolution of pain will likely help reduce the risk of recurrence. Referral to a physical therapist or athletic trainer for further evaluation and treatment may be helpful.  A walking boot or cast may be recommended to rest the Achilles tendon. This can help break the cycle of inflammation and microtrauma.  Arch supports (orthotics) may be prescribed or recommended by your caregiver as an adjunct to therapy and rest.  Surgery to remove the inflamed tendon lining or degenerated tendon tissue is rarely necessary and has shown less than predictable results. MEDICATION   Nonsteroidal anti-inflammatory medications, such as aspirin and ibuprofen, may be used for pain and inflammation relief. Do not take within 7 days before surgery. Take these as directed by your caregiver. Contact your caregiver immediately if any bleeding, stomach upset, or signs of allergic reaction occur. Other minor pain relievers, such as acetaminophen, may also be used.  Pain relievers may be prescribed as necessary by your caregiver. Do not take prescription pain medication for longer than 4 to 7 days. Use only as directed and only as much as you need.  Cortisone injections are rarely indicated. Cortisone injections may weaken tendons and predispose to rupture. It is better to give the condition more time to heal than to use them. HEAT AND COLD  Cold is used to relieve pain and reduce inflammation for acute and chronic Achilles tendinitis. Cold should be applied for 10 to 15 minutes   every 2 to 3 hours for inflammation and pain and immediately after any activity that aggravates your symptoms. Use ice packs or an ice  massage.  Heat may be used before performing stretching and strengthening activities prescribed by your caregiver. Use a heat pack or a warm soak. SEEK MEDICAL CARE IF:  Symptoms get worse or do not improve in 2 weeks despite treatment.  New, unexplained symptoms develop. Drugs used in treatment may produce side effects.  EXERCISES:  RANGE OF MOTION (ROM) AND STRETCHING EXERCISES - Achilles Tendinitis  These exercises may help you when beginning to rehabilitate your injury. Your symptoms may resolve with or without further involvement from your physician, physical therapist or athletic trainer. While completing these exercises, remember:   Restoring tissue flexibility helps normal motion to return to the joints. This allows healthier, less painful movement and activity.  An effective stretch should be held for at least 30 seconds.  A stretch should never be painful. You should only feel a gentle lengthening or release in the stretched tissue.  STRETCH  Gastroc, Standing   Place hands on wall.  Extend right / left leg, keeping the front knee somewhat bent.  Slightly point your toes inward on your back foot.  Keeping your right / left heel on the floor and your knee straight, shift your weight toward the wall, not allowing your back to arch.  You should feel a gentle stretch in the right / left calf. Hold this position for 10 seconds. Repeat 3 times. Complete this stretch 2 times per day.  STRETCH  Soleus, Standing   Place hands on wall.  Extend right / left leg, keeping the other knee somewhat bent.  Slightly point your toes inward on your back foot.  Keep your right / left heel on the floor, bend your back knee, and slightly shift your weight over the back leg so that you feel a gentle stretch deep in your back calf.  Hold this position for 10 seconds. Repeat 3 times. Complete this stretch 2 times per day.  STRETCH  Gastrocsoleus, Standing  Note: This exercise can place  a lot of stress on your foot and ankle. Please complete this exercise only if specifically instructed by your caregiver.   Place the ball of your right / left foot on a step, keeping your other foot firmly on the same step.  Hold on to the wall or a rail for balance.  Slowly lift your other foot, allowing your body weight to press your heel down over the edge of the step.  You should feel a stretch in your right / left calf.  Hold this position for 10 seconds.  Repeat this exercise with a slight bend in your knee. Repeat 3 times. Complete this stretch 2 times per day.   STRENGTHENING EXERCISES - Achilles Tendinitis These exercises may help you when beginning to rehabilitate your injury. They may resolve your symptoms with or without further involvement from your physician, physical therapist or athletic trainer. While completing these exercises, remember:   Muscles can gain both the endurance and the strength needed for everyday activities through controlled exercises.  Complete these exercises as instructed by your physician, physical therapist or athletic trainer. Progress the resistance and repetitions only as guided.  You may experience muscle soreness or fatigue, but the pain or discomfort you are trying to eliminate should never worsen during these exercises. If this pain does worsen, stop and make certain you are following the directions exactly. If   the pain is still present after adjustments, discontinue the exercise until you can discuss the trouble with your clinician.  STRENGTH - Plantar-flexors   Sit with your right / left leg extended. Holding onto both ends of a rubber exercise band/tubing, loop it around the ball of your foot. Keep a slight tension in the band.  Slowly push your toes away from you, pointing them downward.  Hold this position for 10 seconds. Return slowly, controlling the tension in the band/tubing. Repeat 3 times. Complete this exercise 2 times per day.     STRENGTH - Plantar-flexors   Stand with your feet shoulder width apart. Steady yourself with a wall or table using as little support as needed.  Keeping your weight evenly spread over the width of your feet, rise up on your toes.*  Hold this position for 10 seconds. Repeat 3 times. Complete this exercise 2 times per day.  *If this is too easy, shift your weight toward your right / left leg until you feel challenged. Ultimately, you may be asked to do this exercise with your right / left foot only.  STRENGTH  Plantar-flexors, Eccentric  Note: This exercise can place a lot of stress on your foot and ankle. Please complete this exercise only if specifically instructed by your caregiver.   Place the balls of your feet on a step. With your hands, use only enough support from a wall or rail to keep your balance.  Keep your knees straight and rise up on your toes.  Slowly shift your weight entirely to your right / left toes and pick up your opposite foot. Gently and with controlled movement, lower your weight through your right / left foot so that your heel drops below the level of the step. You will feel a slight stretch in the back of your calf at the end position.  Use the healthy leg to help rise up onto the balls of both feet, then lower weight only on the right / left leg again. Build up to 15 repetitions. Then progress to 3 consecutive sets of 15 repetitions.*  After completing the above exercise, complete the same exercise with a slight knee bend (about 30 degrees). Again, build up to 15 repetitions. Then progress to 3 consecutive sets of 15 repetitions.* Perform this exercise 2 times per day.  *When you easily complete 3 sets of 15, your physician, physical therapist or athletic trainer may advise you to add resistance by wearing a backpack filled with additional weight.  STRENGTH - Plantar Flexors, Seated   Sit on a chair that allows your feet to rest flat on the ground. If  necessary, sit at the edge of the chair.  Keeping your toes firmly on the ground, lift your right / left heel as far as you can without increasing any discomfort in your ankle. Repeat 3 times. Complete this exercise 2 times a day.  Peroneal Tendinopathy Peroneal tendinopathy is irritation of the tendons that pass behind your ankle (peroneal tendons). These tendons attach muscles in your foot to a bone on the side of your foot and underneath the arch of your foot. This condition can cause your peroneal tendons to get bigger and swell. What are the causes? This condition may be caused by:  Putting stress on your ankle over and over again (overuse injury).  A sudden injury that puts stress on your tendons, such as an ankle sprain.  What increases the risk? This condition is more likely to develop in:  People who have high arches.  Athletes who play sports that involve putting stress on the ankle over and over again. These sports include: ? Running. ? Dancing. ? Soccer. ? Basketball.  What are the signs or symptoms? Symptoms of this condition can start suddenly or develop gradually. Symptoms include:  Pain in the back of the ankle, on the side of the foot, or in the arch of the foot.  Pain that gets worse with activity and better with rest.  Swelling.  Warmth.  Weakness in your foot or ankle.  How is this diagnosed? This condition may be diagnosed based on:  Your symptoms.  Your medical history.  A physical exam.  Imaging tests, such as: ? An X-ray or CT scan to check for bone injury. ? MRI or ultrasound to check for muscle or tendon injury.  During your physical exam, your health care provider may move your foot and ankle and test the strength of your leg muscles. How is this treated? This condition may be treated by:  Keeping your body weight off your ankle for several days.  Returning gradually to full activity gradually.  Putting ice on your ankle to reduce  swelling.  Taking an anti-inflammatory pain medicine (NSAID).  Having medicine injected into your tendon to reduce swelling.  Wearing a removable boot or brace for ankle support.  Doing range-of-motion exercises and strengthening exercises (physical therapy) when pain and swelling improve.  If the condition does not improve with treatment, or if a tendon or muscle is damaged, surgery may be needed. Follow these instructions at home: If you have a boot or brace:  Wear it as told by your health care provider. Remove it only as told by your health care provider.  Loosen it if your toes tingle, become numb, or turn cold and blue.  Do not let it get wet if it is not waterproof.  Keep it clean. Managing pain, stiffness, and swelling  If directed, apply ice to the injured area: ? Put ice in a plastic bag. ? Place a towel between your skin and the bag. ? Leave the ice on for 20 minutes, 2-3 times a day.  Take over-the-counter and prescription medicines only as told by your health care provider.  Raise (elevate) your ankle above the level of your heart when resting if you have swelling. Activity  Do not use your ankle to support (bear) your full body weight until your health care provider says that you can.  Do not do activities that make pain or swelling worse.  Return to your normal activities as told by your health care provider. General instructions  Keep all follow-up visits as told by your health care provider. This is important. How is this prevented?  Wear supportive footwear that is appropriate for your athletic activity.  Avoid athletic activities that cause swelling or pain in your ankle or foot.  See your health care provider if you have pain or swelling that does not improve after a few days of rest.  Stop training if you develop pain or swelling.  If you start a new athletic activity, start gradually to build up your strength, endurance, and  flexibility. Contact a health care provider if:  Your symptoms get worse.  Your symptoms do not improve in 2-4 weeks.  You develop new, unexplained symptoms. This information is not intended to replace advice given to you by your health care provider. Make sure you discuss any questions you have with your health care  provider. Document Released: 07/25/2005 Document Revised: 03/29/2016 Document Reviewed: 06/13/2015 Else[employer] Interactive Patient Education  2018 [employer] Posterior Tibialis Tendinosis Posterior tibialis tendinosis is irritation and degeneration of a tendon called the posterior tibial tendon. Your posterior tibial tendon is a cord-like tissue that connects bones of your lower leg and foot to a muscle that:  Supports your arch.  Helps you raise up on your toes.  Helps you turn your foot down and in.  This condition causes foot and ankle pain and can lead to a flat foot. What are the causes? This condition is most often caused by repeated stress to the tendon (overuse injury). It can also be caused by a sudden injury that stresses the tendon, such as landing on your foot after jumping or falling. What increases the risk? This condition is more likely to develop in:  People who play a sport that involves putting a lot of pressure on the feet, such as: ? Basketball. ? Tennis. ? Soccer. ? Hockey.  Runners.  Females who are older than 40 years and are overweight.  People with diabetes.  People with decreased foot stability (ligamentous laxity).  People with flat feet.  What are the signs or symptoms? Symptoms of this condition may start suddenly or gradually. Symptoms include:  Pain in the inner ankle.  Pain at the arch of your foot.  Pain that gets worse with running, walking, or standing.  Swelling on the inside of your ankle and foot.  Weakness in your ankle or foot.  Inability to stand up on tiptoe.  How is this diagnosed? This condition may be  diagnosed based on:  Your symptoms.  Your medical history.  A physical exam.  Tests, such as: ? An X-ray. ? MRI. ? An ultrasound.  During the physical exam, your health care provider may move your foot and ankle, test your strength and balance, and check the arch of your foot while you stand or walk. How is this treated? This condition may be treated by:  Replacing high-impact exercise with low-impact exercise, such as swimming or cycling.  Applying ice to the injured area.  Taking an anti-inflammatory pain medicine.  Physical therapy.  Wearing a special shoe or shoe insert to support your arch (orthotic).  If your symptoms do not improve with these treatments, you may need to wear a splint, removable walking boot, or short leg cast for 6-8 weeks to keep your foot and ankle still. Follow these instructions at home: If you have a boot or splint:  Wear the boot or splint as told by your health care provider. Remove it only as told by your health care provider.  Do not use your foot to support (bear) your full body weight until your health care provider says that you can.  Loosen the boot or splint if your toes tingle, become numb, or turn cold and blue.  Keep the boot or splint clean.  If your boot or splint is not waterproof: ? Do not let it get wet. ? Cover it with a watertight plastic bag when you take a bath or shower. If you have a cast:  Do not stick anything inside the cast to scratch your skin. Doing that increases your risk of infection.  Check the skin around the cast every day. Tell your health care provider about any concerns.  You may put lotion on dry skin around the edges of the cast. Do not apply lotion to the skin underneath the cast.  Keep  the cast clean.  Do not take baths, swim, or use a hot tub until your health care provider approves. Ask your health care provider if you can take showers. You may only be allowed to take sponge baths for  bathing.  If your cast is not waterproof: ? Do not let it get wet. ? Cover it with a watertight plastic bag while you take a bath or a shower. Managing pain and swelling  Take over-the-counter and prescription medicines only as told by your health care provider.  If directed, apply ice to the injured area: ? Put ice in a plastic bag. ? Place a towel between your skin and the bag. ? Leave the ice on for 20 minutes, 2-3 times a day.  Raise (elevate) your ankle above the level of your heart when resting if you have swelling. Activity  Do not do activities that make pain or swelling worse.  Return to full activity gradually as symptoms improve.  Do exercises as told by your health care provider. General instructions  If you have an orthotic, use it as told by your health care provider.  Keep all follow-up visits as told by your health care provider. This is important. How is this prevented?  Wear footwear that is appropriate to your athletic activity.  Avoid athletic activities that cause pain or swelling in your ankle or foot.  Before being active, do range-of-motion and stretching exercises.  If you develop pain or swelling while training, stop training.  If you have pain or swelling that does not improve after a few days of rest, see your health care provider.  If you start a new athletic activity, start gradually so you can build up your strength and flexibility. Contact a health care provider if:  Your symptoms get worse.  Your symptoms do not improve in 6-8 weeks.  You develop new, unexplained symptoms.  Your splint, boot, or cast gets damaged. This information is not intended to replace advice given to you by your health care provider. Make sure you discuss any questions you have with your health care provider. Document Released: 07/25/2005 Document Revised: 03/29/2016 Document Reviewed: 04/10/2015 Elsevier Interactive Patient Education  [employer].

## 2018-06-03 NOTE — Progress Notes (Signed)
  Subjective:  Patient ID: Philip Dorsey, male    DOB: [birth year],  MRN: [id number]  Chief Complaint  Patient presents with  . Foot Pain    right tendon pain, left ankle pain    47 y.o. male presents with the above complaint.  Reports pain to the both feet.  Has had issues with the right Achilles tendon and has worn a brace for this in the past also starting to have pain in the inside of the left ankle states he is very active.  Denies other pedal issues  Review of Systems: Negative except as noted in the HPI. Denies N/V/F/Ch.  Past Medical History:  Diagnosis Date  . History of nuclear stress test 02/2010   bruce myoview; normal pattern of perfusion, no signficant ischemia  . Hyperlipidemia   . Loose body of right knee     Current Outpatient Medications:  .  esomeprazole (NEXIUM) 40 MG capsule, Take 40 mg by mouth 2 (two) times daily before a meal., Disp: , Rfl:  .  gabapentin (NEURONTIN) 100 MG capsule, Take by mouth., Disp: , Rfl:  .  HYDROcodone-acetaminophen (NORCO/VICODIN) 5-325 MG tablet, Take by mouth., Disp: , Rfl:  .  methylPREDNISolone (MEDROL DOSEPAK) 4 MG TBPK tablet, 6 Day Taper Pack. Take as Directed., Disp: 21 tablet, Rfl: 0 .  TOBRADEX ST 0.3-0.05 % SUSP, PLACE 1 DROP IN LEFT EYE 4 TIMES DAILY FOR 5 DAYS, Disp: , Rfl: 0 .  traMADol (ULTRAM) 50 MG tablet, Take by mouth., Disp: , Rfl:   Social History   Tobacco Use  Smoking Status Former Smoker  . Last attempt to quit: 06/18/2001  . Years since quitting: 16.9  Smokeless Tobacco Never Used    Allergies  Allergen Reactions  . Amoxicillin Other (See Comments)    Itchy throat, felt like a knot in throat    Objective:   Vitals:   06/02/18 0933  BP: 130/87  Pulse: 62   There is no height or weight on file to calculate BMI. Constitutional Well developed. Well nourished.  Vascular Dorsalis pedis pulses palpable bilaterally. Posterior tibial pulses palpable bilaterally. Capillary refill normal to all  digits.  No cyanosis or clubbing noted. Pedal hair growth normal.  Neurologic Normal speech. Oriented to person, place, and time. Epicritic sensation to light touch grossly present bilaterally.  Dermatologic Nails well groomed and normal in appearance. No open wounds. No skin lesions.  Orthopedic: Normal joint ROM without pain or crepitus bilaterally. No visible deformities. Palpation about the right Achilles tendon posteriorly, peroneal tendons of the course of left lateral malleolus, palpation about the left posterior tibial tendon as it courses about the medial malleolus.   Radiographs: Taken reviewed no acute fractures dislocations. Assessment:   1. Tendinitis of ankle or foot   2. Posterior tibial tendon dysfunction (PTTD) of left lower extremity   3. Tendonitis, Achilles, right   4. Peroneal tendonitis, right   5. Synovitis and tenosynovitis    Plan:  Patient was evaluated and treated and all questions answered.  Achilles, Peroneal Tendonitis R, PTTD L -Dispense Trilock ankle brace bilat -Dispense night splint for use on the right side. -Refer to PT -Rx for Medrol pack.  Educated on use. -Offered injection left PT tendon.  Patient declined.  Return in about 4 weeks (around 06/30/2018) for Achilles/Peroneal Tendonitis R, PTTD Left .

## 2018-06-05 NOTE — Addendum Note (Signed)
Addended by: Alphia Kava D on: 06/05/2018 03:13 PM   Modules accepted: Orders

## 2018-06-29 ENCOUNTER — Ambulatory Visit (INDEPENDENT_AMBULATORY_CARE_PROVIDER_SITE_OTHER): Payer: Commercial Managed Care - PPO | Admitting: Podiatry

## 2018-06-29 DIAGNOSIS — M7671 Peroneal tendinitis, right leg: Secondary | ICD-10-CM | POA: Diagnosis not present

## 2018-06-29 DIAGNOSIS — M76822 Posterior tibial tendinitis, left leg: Secondary | ICD-10-CM | POA: Diagnosis not present

## 2018-06-29 DIAGNOSIS — M7661 Achilles tendinitis, right leg: Secondary | ICD-10-CM | POA: Diagnosis not present

## 2018-06-29 DIAGNOSIS — M659 Synovitis and tenosynovitis, unspecified: Secondary | ICD-10-CM | POA: Diagnosis not present

## 2018-06-29 MED ORDER — DICLOFENAC SODIUM 1 % TD GEL: 2.0000 g | Freq: Four times a day (QID) | TRANSDERMAL | 0 refills | Status: DC

## 2018-06-29 NOTE — Progress Notes (Signed)
Subjective:  Patient ID: Philip Dorsey, male    DOB: 21-Jan-1971,  MRN: [id number]  Chief Complaint  Patient presents with  . Tendonitis     PTTD left, right achilles tendinitis    47 y.o. male presents with the above complaint.  States both feet were doing much better after he was on the prednisone but it came back when he finished it.  States the trial of braces do help.  Unable to go to PT due to cost.  Review of Systems: Negative except as noted in the HPI. Denies N/V/F/Ch.  Past Medical History:  Diagnosis Date  . History of nuclear stress test 02/2010   bruce myoview; normal pattern of perfusion, no signficant ischemia  . Hyperlipidemia   . Loose body of right knee     Current Outpatient Medications:  .  dicyclomine (BENTYL) 10 MG capsule, One po AC, Disp: , Rfl:  .  diclofenac sodium (VOLTAREN) 1 % GEL, Apply 2 g topically 4 (four) times daily., Disp: 100 g, Rfl: 0 .  esomeprazole (NEXIUM) 40 MG capsule, Take 40 mg by mouth 2 (two) times daily before a meal., Disp: , Rfl:  .  gabapentin (NEURONTIN) 100 MG capsule, Take by mouth., Disp: , Rfl:  .  HYDROcodone-acetaminophen (NORCO/VICODIN) 5-325 MG tablet, Take by mouth., Disp: , Rfl:  .  methylPREDNISolone (MEDROL DOSEPAK) 4 MG TBPK tablet, 6 Day Taper Pack. Take as Directed., Disp: 21 tablet, Rfl: 0 .  TOBRADEX ST 0.3-0.05 % SUSP, PLACE 1 DROP IN LEFT EYE 4 TIMES DAILY FOR 5 DAYS, Disp: , Rfl: 0 .  traMADol (ULTRAM) 50 MG tablet, Take by mouth., Disp: , Rfl:   Social History   Tobacco Use  Smoking Status Former Smoker  . Last attempt to quit: 06/18/2001  . Years since quitting: 17.0  Smokeless Tobacco Never Used    Allergies  Allergen Reactions  . Amoxicillin Other (See Comments)    Itchy throat, felt like a knot in throat    Objective:   There were no vitals filed for this visit. There is no height or weight on file to calculate BMI. Constitutional Well developed. Well nourished.  Vascular Dorsalis pedis  pulses palpable bilaterally. Posterior tibial pulses palpable bilaterally. Capillary refill normal to all digits.  No cyanosis or clubbing noted. Pedal hair growth normal.  Neurologic Normal speech. Oriented to person, place, and time. Epicritic sensation to light touch grossly present bilaterally.  Dermatologic Nails well groomed and normal in appearance. No open wounds. No skin lesions.  Orthopedic: Normal joint ROM without pain or crepitus bilaterally. No visible deformities. Palpation about the right Achilles tendon posteriorly, peroneal tendons of the course of left lateral malleolus, palpation about the left posterior tibial tendon as it courses about the medial malleolus.   Radiographs: Taken reviewed no acute fractures dislocations. Assessment:   1. Tendonitis, Achilles, right   2. Peroneal tendonitis, right   3. Synovitis and tenosynovitis   4. Posterior tibial tendon dysfunction (PTTD) of left lower extremity    Plan:  Patient was evaluated and treated and all questions answered.  Achilles, Peroneal Tendonitis R, PTTD L -Continue aggressive exercises right lower extremity.  Transition out of Tri-Lock brace right.  Continue Tri-Lock brace left -Unable to do PT due to cost. -Rx Voltaren gel.  Educated on use. -Injection L PTT  Procedure: Tendon Injection Location: Left PTT Skin Prep: Alcohol. Injectate: 0.5 cc 1% lidocaine plain, 1 cc dexamethasone phosphate. Disposition: Patient tolerated procedure well. Injection site dressed  with a band-aid.   Return in about 4 weeks (around 07/27/2018) for PT Tendonitis L, Achilles Tendonitis R.

## 2018-08-08 HISTORY — PX: OTHER SURGICAL HISTORY: SHX169

## 2018-08-14 ENCOUNTER — Other Ambulatory Visit: Payer: Self-pay | Admitting: Physician Assistant

## 2018-08-14 DIAGNOSIS — N50819 Testicular pain, unspecified: Secondary | ICD-10-CM

## 2018-08-23 ENCOUNTER — Other Ambulatory Visit: Payer: BLUE CROSS/BLUE SHIELD

## 2018-08-27 ENCOUNTER — Ambulatory Visit
Admission: RE | Admit: 2018-08-27 | Discharge: 2018-08-27 | Disposition: A | Discharge status: Home / Self Care | Payer: Commercial Managed Care - PPO | Source: Ambulatory Visit | Attending: Physician Assistant | Admitting: Physician Assistant

## 2018-08-27 DIAGNOSIS — N50819 Testicular pain, unspecified: Secondary | ICD-10-CM

## 2019-04-08 ENCOUNTER — Encounter: Payer: Self-pay | Admitting: Podiatry

## 2019-04-08 ENCOUNTER — Ambulatory Visit (INDEPENDENT_AMBULATORY_CARE_PROVIDER_SITE_OTHER): Payer: Commercial Managed Care - PPO

## 2019-04-08 ENCOUNTER — Other Ambulatory Visit: Payer: Self-pay | Admitting: Podiatry

## 2019-04-08 ENCOUNTER — Other Ambulatory Visit: Payer: Self-pay

## 2019-04-08 ENCOUNTER — Ambulatory Visit (INDEPENDENT_AMBULATORY_CARE_PROVIDER_SITE_OTHER): Payer: Commercial Managed Care - PPO | Admitting: Podiatry

## 2019-04-08 DIAGNOSIS — M659 Synovitis and tenosynovitis, unspecified: Secondary | ICD-10-CM

## 2019-04-08 DIAGNOSIS — M76822 Posterior tibial tendinitis, left leg: Secondary | ICD-10-CM

## 2019-04-08 DIAGNOSIS — M65972 Unspecified synovitis and tenosynovitis, left ankle and foot: Secondary | ICD-10-CM

## 2019-04-10 NOTE — Progress Notes (Signed)
   Subjective:  48 y.o. male presenting today for follow up evaluation of left ankle pain. He states he received an injection at his last visit here several months ago which last for a few months. He has been seeing Dr. Gershon Mussel and states he was told he a fracture in the medial left ankle. He has been using a CAM boot sporadically. Walking without the boot increases the pain. Patient is here for further evaluation and treatment.   Past Medical History:  Diagnosis Date  . History of nuclear stress test 02/2010   bruce myoview; normal pattern of perfusion, no signficant ischemia  . Hyperlipidemia   . Loose body of right knee      Objective / Physical Exam:  General:  The patient is alert and oriented x3 in no acute distress. Dermatology:  Skin is warm, dry and supple bilateral lower extremities. Negative for open lesions or macerations. Vascular:  Palpable pedal pulses bilaterally. No edema or erythema noted. Capillary refill within normal limits. Neurological:  Epicritic and protective threshold grossly intact bilaterally.  Musculoskeletal Exam:  Pain on palpation to the anterior lateral medial aspects of the patient's left ankle. Mild edema noted. Range of motion within normal limits to all pedal and ankle joints bilateral. Muscle strength 5/5 in all groups bilateral.   Radiographic Exam:  Nondisplaced, closed stress fracture noted to the medial malleolus left.    Assessment: 1. Ankle synovitis left  2. Stress fracture medial malleolus left, nondisplaced   Plan of Care:  1. Patient was evaluated. X-Rays reviewed.  2. Injection of 0.5 mL Celestone Soluspan injected in the patient's left ankle. 3. Recommended using CAM boot as much as possible.  4. Recommended ankle brace when he cannot use CAM boot.  5. Return to clinic in 8 weeks.   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    Edrick Kins, DPM [facility]  Dr. Edrick Kins,  [address]                                        [place], [place] [address]                Office [phone]  Fax [phone]

## 2019-06-03 ENCOUNTER — Ambulatory Visit: Payer: Commercial Managed Care - PPO | Admitting: Podiatry

## 2019-11-22 ENCOUNTER — Encounter (HOSPITAL_BASED_OUTPATIENT_CLINIC_OR_DEPARTMENT_OTHER): Payer: Self-pay | Admitting: Orthopedic Surgery

## 2019-11-22 ENCOUNTER — Other Ambulatory Visit: Payer: Self-pay

## 2019-11-22 NOTE — Progress Notes (Signed)
Spoke w/ via phone for pre-op interview---PATIENT Lab needs dos---- none            Lab results------sleep study 04-25-2019 epic COVID test ------4-20- 2021 1500 Arrive at -------1200 pm 11-29-2019 No food after midnight , clear liquids until 900 am then npo Medications to take morning of surgery -----none Diabetic medication -----n/a Patient Special Instructions -----none Pre-Op special Istructions -----none Patient verbalized understanding of instructions that were given at this phone interview. Patient denies shortness of breath, chest pain, fever, cough a this phone interview.

## 2019-11-26 ENCOUNTER — Other Ambulatory Visit (HOSPITAL_COMMUNITY)
Admission: RE | Admit: 2019-11-26 | Discharge: 2019-11-26 | Disposition: A | Discharge status: Home / Self Care | Payer: Commercial Managed Care - PPO | Source: Ambulatory Visit | Attending: Orthopedic Surgery | Admitting: Orthopedic Surgery

## 2019-11-26 DIAGNOSIS — Z01812 Encounter for preprocedural laboratory examination: Secondary | ICD-10-CM | POA: Insufficient documentation

## 2019-11-26 DIAGNOSIS — Z20822 Contact with and (suspected) exposure to covid-19: Secondary | ICD-10-CM | POA: Diagnosis not present

## 2019-11-27 LAB — SARS CORONAVIRUS 2 (TAT 6-24 HRS): SARS Coronavirus 2: NEGATIVE

## 2019-11-29 ENCOUNTER — Encounter (HOSPITAL_BASED_OUTPATIENT_CLINIC_OR_DEPARTMENT_OTHER): Payer: Self-pay | Admitting: Orthopedic Surgery

## 2019-11-29 ENCOUNTER — Encounter (HOSPITAL_BASED_OUTPATIENT_CLINIC_OR_DEPARTMENT_OTHER)
Admission: RE | Disposition: A | Discharge status: Home / Self Care | Payer: Self-pay | Source: Home / Self Care | Attending: Orthopedic Surgery

## 2019-11-29 ENCOUNTER — Ambulatory Visit (HOSPITAL_BASED_OUTPATIENT_CLINIC_OR_DEPARTMENT_OTHER)
Admission: RE | Admit: 2019-11-29 | Discharge: 2019-11-29 | Disposition: A | Discharge status: Home / Self Care | Payer: Commercial Managed Care - PPO | Attending: Orthopedic Surgery | Admitting: Orthopedic Surgery

## 2019-11-29 ENCOUNTER — Ambulatory Visit (HOSPITAL_BASED_OUTPATIENT_CLINIC_OR_DEPARTMENT_OTHER): Payer: Commercial Managed Care - PPO | Admitting: Anesthesiology

## 2019-11-29 DIAGNOSIS — G4730 Sleep apnea, unspecified: Secondary | ICD-10-CM | POA: Diagnosis not present

## 2019-11-29 DIAGNOSIS — Z87891 Personal history of nicotine dependence: Secondary | ICD-10-CM | POA: Diagnosis not present

## 2019-11-29 DIAGNOSIS — M23221 Derangement of posterior horn of medial meniscus due to old tear or injury, right knee: Secondary | ICD-10-CM | POA: Diagnosis not present

## 2019-11-29 DIAGNOSIS — Z880 Allergy status to penicillin: Secondary | ICD-10-CM | POA: Insufficient documentation

## 2019-11-29 DIAGNOSIS — M659 Synovitis and tenosynovitis, unspecified: Secondary | ICD-10-CM | POA: Diagnosis not present

## 2019-11-29 DIAGNOSIS — M23231 Derangement of other medial meniscus due to old tear or injury, right knee: Secondary | ICD-10-CM | POA: Diagnosis not present

## 2019-11-29 HISTORY — DX: Other tear of lateral meniscus, current injury, right knee, initial encounter: S83.281A

## 2019-11-29 HISTORY — DX: Sleep apnea, unspecified: G47.30

## 2019-11-29 HISTORY — PX: KNEE ARTHROSCOPY WITH MEDIAL MENISECTOMY: SHX5651

## 2019-11-29 SURGERY — ARTHROSCOPY, KNEE, WITH MEDIAL MENISCECTOMY
Anesthesia: General | Site: Knee | Laterality: Right

## 2019-11-29 MED ORDER — BUPIVACAINE HCL 0.25 % IJ SOLN
INTRAMUSCULAR | Status: DC | PRN
  Administered 2019-11-29: 30 mL

## 2019-11-29 MED ORDER — CEFAZOLIN SODIUM-DEXTROSE 2-4 GM/100ML-% IV SOLN
INTRAVENOUS | Status: AC
  Filled 2019-11-29: qty 100

## 2019-11-29 MED ORDER — LACTATED RINGERS IV SOLN: INTRAVENOUS | Status: DC

## 2019-11-29 MED ORDER — FENTANYL CITRATE (PF) 100 MCG/2ML IJ SOLN
INTRAMUSCULAR | Status: AC
  Filled 2019-11-29: qty 2

## 2019-11-29 MED ORDER — ONDANSETRON HCL 4 MG/2ML IJ SOLN
INTRAMUSCULAR | Status: DC | PRN
  Administered 2019-11-29: 4 mg via INTRAVENOUS

## 2019-11-29 MED ORDER — FENTANYL CITRATE (PF) 100 MCG/2ML IJ SOLN
INTRAMUSCULAR | Status: DC | PRN
  Administered 2019-11-29 (×2): 50 ug via INTRAVENOUS

## 2019-11-29 MED ORDER — MIDAZOLAM HCL 5 MG/5ML IJ SOLN
INTRAMUSCULAR | Status: DC | PRN
  Administered 2019-11-29: 2 mg via INTRAVENOUS

## 2019-11-29 MED ORDER — OXYCODONE HCL 5 MG/5ML PO SOLN: 5.0000 mg | Freq: Once | ORAL | Status: AC | PRN

## 2019-11-29 MED ORDER — LIDOCAINE 2% (20 MG/ML) 5 ML SYRINGE
INTRAMUSCULAR | Status: AC
  Filled 2019-11-29: qty 5

## 2019-11-29 MED ORDER — SODIUM CHLORIDE 0.9 % IR SOLN
Status: DC | PRN
  Administered 2019-11-29: 3000 mL

## 2019-11-29 MED ORDER — MIDAZOLAM HCL 2 MG/2ML IJ SOLN
INTRAMUSCULAR | Status: AC
  Filled 2019-11-29: qty 2

## 2019-11-29 MED ORDER — FENTANYL CITRATE (PF) 100 MCG/2ML IJ SOLN
25.0000 ug | INTRAMUSCULAR | Status: DC | PRN
  Administered 2019-11-29: 16:00:00 25 ug via INTRAVENOUS

## 2019-11-29 MED ORDER — KETOROLAC TROMETHAMINE 30 MG/ML IJ SOLN
30.0000 mg | Freq: Once | INTRAMUSCULAR | Status: AC | PRN
  Administered 2019-11-29: 30 mg via INTRAVENOUS

## 2019-11-29 MED ORDER — PROPOFOL 10 MG/ML IV BOLUS
INTRAVENOUS | Status: AC
  Filled 2019-11-29: qty 40

## 2019-11-29 MED ORDER — KETOROLAC TROMETHAMINE 30 MG/ML IJ SOLN
INTRAMUSCULAR | Status: AC
  Filled 2019-11-29: qty 1

## 2019-11-29 MED ORDER — LIDOCAINE HCL (CARDIAC) PF 100 MG/5ML IV SOSY
PREFILLED_SYRINGE | INTRAVENOUS | Status: DC | PRN
  Administered 2019-11-29: 100 mg via INTRAVENOUS

## 2019-11-29 MED ORDER — ONDANSETRON 4 MG PO TBDP
4.0000 mg | ORAL_TABLET | Freq: Three times a day (TID) | ORAL | 0 refills | Status: DC | PRN
Start: 2019-11-29 — End: 2024-06-21

## 2019-11-29 MED ORDER — HYDROCODONE-ACETAMINOPHEN 5-325 MG PO TABS: 1.0000 | ORAL_TABLET | ORAL | 0 refills | Status: AC | PRN

## 2019-11-29 MED ORDER — OXYCODONE HCL 5 MG PO TABS
ORAL_TABLET | ORAL | Status: AC
  Filled 2019-11-29: qty 1

## 2019-11-29 MED ORDER — PROPOFOL 10 MG/ML IV BOLUS
INTRAVENOUS | Status: DC | PRN
  Administered 2019-11-29: 250 mg via INTRAVENOUS

## 2019-11-29 MED ORDER — OXYCODONE HCL 5 MG PO TABS
5.0000 mg | ORAL_TABLET | Freq: Once | ORAL | Status: AC | PRN
  Administered 2019-11-29: 5 mg via ORAL

## 2019-11-29 MED ORDER — CEFAZOLIN SODIUM-DEXTROSE 2-4 GM/100ML-% IV SOLN
2.0000 g | INTRAVENOUS | Status: AC
  Administered 2019-11-29: 2 g via INTRAVENOUS

## 2019-11-29 SURGICAL SUPPLY — 33 items
ABLATOR ASPIRATE 50D MULTI-PRT (SURGICAL WAND) IMPLANT
BANDAGE ESMARK 6X9 LF (GAUZE/BANDAGES/DRESSINGS) IMPLANT
BLADE SHAVER TORPEDO 4X13 (MISCELLANEOUS) ×2 IMPLANT
BNDG ELASTIC 6X5.8 VLCR STR LF (GAUZE/BANDAGES/DRESSINGS) ×2 IMPLANT
BNDG ESMARK 6X9 LF (GAUZE/BANDAGES/DRESSINGS)
COVER WAND RF STERILE (DRAPES) ×2 IMPLANT
CUFF TOURN SGL QUICK 34 (TOURNIQUET CUFF) ×2
CUFF TRNQT CYL 34X4.125X (TOURNIQUET CUFF) ×1 IMPLANT
DRAPE ARTHROSCOPY W/POUCH 114 (DRAPES) ×2 IMPLANT
DRAPE U-SHAPE 47X51 STRL (DRAPES) ×2 IMPLANT
DRSG PAD ABDOMINAL 8X10 ST (GAUZE/BANDAGES/DRESSINGS) ×2 IMPLANT
DURAPREP 26ML APPLICATOR (WOUND CARE) ×2 IMPLANT
GAUZE SPONGE 4X4 12PLY STRL (GAUZE/BANDAGES/DRESSINGS) ×2 IMPLANT
GAUZE SPONGE 4X4 12PLY STRL LF (GAUZE/BANDAGES/DRESSINGS) ×1 IMPLANT
GAUZE XEROFORM 1X8 LF (GAUZE/BANDAGES/DRESSINGS) ×2 IMPLANT
GLOVE BIO SURGEON STRL SZ7.5 (GLOVE) ×2 IMPLANT
GLOVE BIOGEL PI IND STRL 8 (GLOVE) ×1 IMPLANT
GLOVE BIOGEL PI INDICATOR 8 (GLOVE) ×1
GOWN STRL REUS W/TWL XL LVL3 (GOWN DISPOSABLE) IMPLANT
IV NS IRRIG 3000ML ARTHROMATIC (IV SOLUTION) ×4 IMPLANT
KIT TURNOVER CYSTO (KITS) ×2 IMPLANT
MANIFOLD NEPTUNE II (INSTRUMENTS) ×2 IMPLANT
PACK DSU ARTHROSCOPY (CUSTOM PROCEDURE TRAY) ×2 IMPLANT
PAD ARMBOARD 7.5X6 YLW CONV (MISCELLANEOUS) IMPLANT
PORT APPOLLO RF 90DEGREE MULTI (SURGICAL WAND) IMPLANT
SET BASIN DAY SURGERY F.S. (CUSTOM PROCEDURE TRAY) ×2 IMPLANT
SUT ETHILON 4 0 PS 2 18 (SUTURE) ×2 IMPLANT
SYR CONTROL 10ML LL (SYRINGE) ×2 IMPLANT
TOWEL OR 17X26 10 PK STRL BLUE (TOWEL DISPOSABLE) ×2 IMPLANT
TUBE CONNECTING 12X1/4 (SUCTIONS) ×4 IMPLANT
TUBING ARTHROSCOPY IRRIG 16FT (MISCELLANEOUS) ×2 IMPLANT
WATER STERILE IRR 500ML POUR (IV SOLUTION) ×2 IMPLANT
WRAP KNEE MAXI GEL POST OP (GAUZE/BANDAGES/DRESSINGS) ×1 IMPLANT

## 2019-11-29 NOTE — Brief Op Note (Signed)
11/29/2019  2:56 PM  PATIENT:  Philip Dorsey  49 y.o. male  PRE-OPERATIVE DIAGNOSIS:  Right knee medial meniscus tear  POST-OPERATIVE DIAGNOSIS:  Right knee medial meniscus tear  PROCEDURE:  Procedure(s): KNEE ARTHROSCOPY WITH PARTIAL MEDIAL MENISECTOMY (Right)  SURGEON:  Surgeon(s) and Role:    * Yolonda Kida, MD - Primary  PHYSICIAN ASSISTANT:   ASSISTANTS: none   ANESTHESIA:   general  EBL: 10 cc  BLOOD ADMINISTERED:none  DRAINS: none   LOCAL MEDICATIONS USED:  NONE  SPECIMEN:  No Specimen  DISPOSITION OF SPECIMEN:  N/A  COUNTS:  YES  TOURNIQUET:  * No tourniquets in log *  DICTATION: .Note written in EPIC  PLAN OF CARE: Discharge to home after PACU  PATIENT DISPOSITION:  PACU - hemodynamically stable.   Delay start of Pharmacological VTE agent (>24hrs) due to surgical blood loss or risk of bleeding: not applicable

## 2019-11-29 NOTE — Anesthesia Postprocedure Evaluation (Signed)
Anesthesia Post Note  Patient: KEMAL AMORES  Procedure(s) Performed: KNEE ARTHROSCOPY WITH PARTIAL MEDIAL MENISECTOMY (Right Knee)     Patient location during evaluation: PACU Anesthesia Type: General Level of consciousness: awake and alert Pain management: pain level controlled Vital Signs Assessment: post-procedure vital signs reviewed and stable Respiratory status: spontaneous breathing, nonlabored ventilation, respiratory function stable and patient connected to nasal cannula oxygen Cardiovascular status: blood pressure returned to baseline and stable Postop Assessment: no apparent nausea or vomiting Anesthetic complications: no    Last Vitals:  Vitals:   11/29/19 1537 11/29/19 1541  BP: 124/77   Pulse: 63 64  Resp: 13 13  Temp:    SpO2: 100% 100%    Last Pain:  Vitals:   11/29/19 1535  TempSrc:   PainSc: 4                  Kohner Orlick S

## 2019-11-29 NOTE — Transfer of Care (Signed)
Immediate Anesthesia Transfer of Care Note  Patient: Philip Dorsey  Procedure(s) Performed: KNEE ARTHROSCOPY WITH PARTIAL MEDIAL MENISECTOMY (Right Knee)  Patient Location: PACU  Anesthesia Type:General  Level of Consciousness: awake, alert  and oriented  Airway & Oxygen Therapy: Patient Spontanous Breathing and Patient connected to nasal cannula oxygen  Post-op Assessment: Report given to RN and Post -op Vital signs reviewed and stable  Post vital signs: Reviewed and stable  Last Vitals:  Vitals Value Taken Time  BP 132/79 11/29/19 1503  Temp    Pulse 65 11/29/19 1507  Resp 5 11/29/19 1507  SpO2 99 % 11/29/19 1507  Vitals shown include unvalidated device data.  Last Pain:  Vitals:   11/29/19 1228  TempSrc: Oral  PainSc: 5       Patients Stated Pain Goal: 6 (11/29/19 1228)  Complications: No apparent anesthesia complications

## 2019-11-29 NOTE — H&P (Signed)
ORTHOPAEDIC H and P  REQUESTING PHYSICIAN: Yolonda Kida, MD  PCP:  April Manson, NP  Chief Complaint: Right knee pain  HPI: Philip Dorsey is a 49 y.o. male who complains of right knee pain and mechanical symptoms.  He has known to me for history of arthroscopic partial medial meniscectomy performed a little over a year ago.  He has had recurrent mechanical symptoms and knee pain.  This is causing him pain that has been recalcitrant to injections and activity modification.  He is here today for arthroscopic partial medial meniscectomy.  Past Medical History:  Diagnosis Date  . Abnormal EKG 2011   RIGHT BBB, STRESS TEST DONE RESULTS NORMAL-13-2011 EPIC  . Acute lateral meniscus tear of right knee   . History of nuclear stress test 02/2010   bruce myoview; normal pattern of perfusion, no signficant ischemia  . Hyperlipidemia   . Loose body of right knee   . Sleep apnea    mild no cpap needed   Past Surgical History:  Procedure Laterality Date  . BACK SURGERY  2018   L 4 TO L5 DR CHARLES BAPTIST   . KNEE ARTHROSCOPY Left 05/26/2006  . KNEE ARTHROSCOPY  12/02/2011   Procedure: ARTHROSCOPY KNEE;  Surgeon: Drucilla Schmidt, MD;  Location: [facility];  Service: Orthopedics;  Laterality: Right;    . LAPAROSCOPIC CHOLECYSTECTOMY  12/11/2009  . LUMBAR DISC SURGERY  1997 x 2   L4  . NASAL SINUS SURGERY    . right knee arthrosocpy  2020   [facility]   Social History   Socioeconomic History  . Marital status: Married    Spouse name: Not on file  . Number of children: Not on file  . Years of education: Not on file  . Highest education level: Not on file  Occupational History  . Not on file  Tobacco Use  . Smoking status: Former Smoker    Packs/day: 1.00    Years: 6.00    Pack years: 6.00    Types: Cigarettes    Quit date: 06/18/2001    Years since quitting: 18.4  . Smokeless tobacco: Never Used  Substance and Sexual Activity  .  Alcohol use: Yes    Comment: OCCASIONAL  . Drug use: No  . Sexual activity: Not on file  Other Topics Concern  . Not on file  Social History Narrative  . Not on file   Social Determinants of Health   Financial Resource Strain:   . Difficulty of Paying Living Expenses:   Food Insecurity:   . Worried About Programme researcher, broadcasting/film/video in the Last Year:   . Barista in the Last Year:   Transportation Needs:   . Freight forwarder (Medical):   Marland Kitchen Lack of Transportation (Non-Medical):   Physical Activity:   . Days of Exercise per Week:   . Minutes of Exercise per Session:   Stress:   . Feeling of Stress :   Social Connections:   . Frequency of Communication with Friends and Family:   . Frequency of Social Gatherings with Friends and Family:   . Attends Religious Services:   . Active Member of Clubs or Organizations:   . Attends Banker Meetings:   Marland Kitchen Marital Status:    Family History  Problem Relation Age of Onset  . Hypertension Father   . Pancreatic cancer Father   . Hyperlipidemia Mother    Allergies  Allergen  Reactions  . Amoxicillin Other (See Comments)    Itchy throat, felt like a knot in throat    Prior to Admission medications   Medication Sig Start Date End Date Taking? Authorizing Provider  acetaminophen (TYLENOL) 650 MG CR tablet Take 1,300 mg by mouth every 8 (eight) hours as needed for pain.   Yes [provider]   No results found.  Positive ROS: All other systems have been reviewed and were otherwise negative with the exception of those mentioned in the HPI and as above.  Physical Exam: General: Alert, no acute distress Cardiovascular: No pedal edema Respiratory: No cyanosis, no use of accessory musculature GI: No organomegaly, abdomen is soft and non-tender Skin: No lesions in the area of chief complaint Neurologic: Sensation intact distally Psychiatric: Patient is competent for consent with normal mood and affect Lymphatic:  No axillary or cervical lymphadenopathy  MUSCULOSKELETAL:  Right knee and leg:  No open wounds or skin lesions.  Neurovascularly intact throughout.  Assessment: 1.  Right knee medial meniscus tear  Plan: -We again reviewed his clinical findings and advanced imaging studies.  He is indicated for arthroscopic partial medial meniscectomy.  We discussed the risk of bleeding, infection, damage to surrounding neurovascular structures, stiffness, development of arthritis, DVT, and the risk of anesthesia.  He has provided informed consent to proceed.  -Plan for discharge home from PACU postoperatively with full weightbearing as tolerated and unrestricted weightbearing as well as unrestricted range of motion.    Nicholes Stairs, MD Cell [phone]    11/29/2019 12:29 PM

## 2019-11-29 NOTE — Discharge Instructions (Signed)
-Okay for full weightbearing as tolerated to the right lower extremity.  You may move the knee as well with unrestricted range of motion.   -Maintain your postoperative bandages for 3 days.  You may remove these bandages on the third day and begin showering.  You may cover your incisions with Band-Aids up until your follow-up appointment with Dr. Aundria Rud.  -Apply ice to the right knee for 30 minutes out of each hour that you can while you are awake.  -For mild to moderate pain use Tylenol and Advil around-the-clock.  You should alternate those and take each 1 every 6 hours respectively.  For breakthrough pain use Norco as needed.  -For the prevention of blood clots take an 81 mg aspirin once per day for 6 weeks.  -Return to see Dr. Aundria Rud in 2 weeks for routine postop care.    Knee Arthroscopy, Care After This sheet gives you information about how to care for yourself after your procedure. Your health care provider may also give you more specific instructions. If you have problems or questions, contact your health care provider. What can I expect after the procedure? After the procedure, it is common to have:  Soreness.  Swelling.  Pain that can be relieved by taking pain medicine. Follow these instructions at home: Incision care   Follow instructions from your health care provider about how to take care of your incisions. Make sure you: ? Wash your hands with soap and water before you change your bandage (dressing). If soap and water are not available, use hand sanitizer. ? Change your dressing as told by your health care provider. ? Leave stitches (sutures), staples, skin glue, or adhesive strips in place. These skin closures may need to stay in place for 2 weeks or longer. If adhesive strip edges start to loosen and curl up, you may trim the loose edges. Do not remove adhesive strips completely unless your health care provider tells you to do that.  Check your incision areas every  day for signs of infection. Check for: ? Redness. ? More swelling or pain. ? Fluid or blood. ? Warmth. ? Pus or a bad smell. Bathing  Do not take baths, swim, or use a hot tub until your health care provider approves. Ask your health care provider if you may take showers. You may only be allowed to take sponge baths. Activity  Do not use your knee to support your body weight until your health care provider says that you can. Follow weight-bearing restrictions as told. Use crutches or other devices to help you move around (assistive devices) as directed.  Ask your health care provider what activities are safe for you during recovery, and what activities you need to avoid.  If physical therapy was prescribed, do exercises as directed. Doing exercises may help improve knee movement and flexibility (range of motion).  Do not lift anything that is heavier than 10 lb (4.5 kg), or the limit that you are told, until your health care provider says that it is safe. Driving  Do not drive until your health care provider approves. You may be able to drive after 1-3 weeks.  Do not drive or use heavy machinery while taking prescription pain medicine. Managing pain, stiffness, and swelling   If directed, put ice on the injured area: ? Put ice in a plastic bag or use the icing device (cold therapy unit) that you were given. Follow instructions from your health care provider about how to use the  icing device. ? Place a towel between your skin and the bag or between your skin and the icing device. ? Leave the ice on for 20 minutes, 2-3 times a day.  Move your toes often to avoid stiffness and to lessen swelling.  Raise (elevate) the injured area above the level of your heart while you are sitting or lying down. If you are taking blood thinners:  Before you take any medicines that contain aspirin or NSAIDs, talk with your health care provider. These medicines increase your risk for dangerous  bleeding.  Take your medicine exactly as told, at the same time every day.  Avoid activities that could cause injury or bruising, and follow instructions about how to prevent falls.  Wear a medical alert bracelet or carry a card that lists what medicines you take. General instructions  Take over-the-counter and prescription medicines only as told by your health care provider.  If you are taking prescription pain medicine, take actions to prevent or treat constipation. Your health care provider may recommend that you: ? Drink enough fluid to keep your urine pale yellow. ? Eat foods that are high in fiber, such as fresh fruits and vegetables, whole grains, and beans. ? Limit foods that are high in fat and processed sugars, such as fried or sweet foods. ? Take an over-the-counter or prescription medicines for constipation.  Do not use any products that contain nicotine or tobacco, such as cigarettes and e-cigarettes. These can delay incision or bone healing. If you need help quitting, ask your health care provider.  Wear compression stockings as told by your health care provider. These stockings help to prevent blood clots and reduce swelling in your legs.  Keep all follow-up visits as told by your health care provider. This is important. Contact a health care provider if you:  Have a fever.  Have severe pain.  Have redness around an incision.  Have more swelling.  Have fluid or blood coming from an incision.  Notice that an incision feels warm to the touch.  Notice pus or a bad smell coming from an incision.  Notice that an incision opens up.  Develop a rash. Get help right away if you:  Have difficulty breathing.  Have shortness of breath.  Have chest pain.  Develop pain in your lower leg or at the back of your knee.  Have numbness or tingling in your lower leg or your foot. Summary  Raise (elevate) the injured area above the level of your heart while you are  sitting or lying down.  To help relieve pain and swelling, put ice on your leg for 20 minutes at a time, 2-3 times a day.  If you were prescribed a blood thinner, avoid activities that could cause injury or bruising, and follow instructions about how to prevent falls.  If physical therapy was prescribed, do exercises as directed. Doing exercises may help improve range of motion. This information is not intended to replace advice given to you by your health care provider. Make sure you discuss any questions you have with your health care provider. Document Revised: 07/07/2017 Document Reviewed: 06/07/2017 Elsevier Patient Education  [place] Instructions  Activity: Get plenty of rest for the remainder of the day. A responsible individual must stay with you for 24 hours following the procedure.  For the next 24 hours, DO NOT: -Drive a car -Paediatric nurse -Drink alcoholic beverages -Take any medication unless instructed  by your physician -Make any legal decisions or sign important papers.  Meals: Start with liquid foods such as gelatin or soup. Progress to regular foods as tolerated. Avoid greasy, spicy, heavy foods. If nausea and/or vomiting occur, drink only clear liquids until the nausea and/or vomiting subsides. Call your physician if vomiting continues.  Special Instructions/Symptoms: Your throat may feel dry or sore from the anesthesia or the breathing tube placed in your throat during surgery. If this causes discomfort, gargle with warm salt water. The discomfort should disappear within 24 hours.  Marland Kitchen

## 2019-11-29 NOTE — Anesthesia Procedure Notes (Signed)
Procedure Name: LMA Insertion Date/Time: 11/29/2019 2:20 PM Performed by: Elgie Congo, CRNA Pre-anesthesia Checklist: Patient identified, Emergency Drugs available, Suction available and Patient being monitored Patient Re-evaluated:Patient Re-evaluated prior to induction Oxygen Delivery Method: Circle system utilized Preoxygenation: Pre-oxygenation with 100% oxygen Induction Type: IV induction Ventilation: Mask ventilation without difficulty LMA: LMA inserted LMA Size: 5.0 Number of attempts: 1 Placement Confirmation: positive ETCO2 and breath sounds checked- equal and bilateral Tube secured with: Tape Dental Injury: Teeth and Oropharynx as per pre-operative assessment

## 2019-11-29 NOTE — Anesthesia Preprocedure Evaluation (Signed)
Anesthesia Evaluation  Patient identified by MRN, date of birth, ID band Patient awake    Reviewed: Allergy & Precautions, NPO status , Patient's Chart, lab work & pertinent test results  Airway Mallampati: II  TM Distance: >3 FB Neck ROM: Full    Dental no notable dental hx.    Pulmonary neg pulmonary ROS, former smoker,    Pulmonary exam normal breath sounds clear to auscultation       Cardiovascular negative cardio ROS Normal cardiovascular exam Rhythm:Regular Rate:Normal     Neuro/Psych negative neurological ROS  negative psych ROS   GI/Hepatic Neg liver ROS, GERD  ,  Endo/Other  negative endocrine ROS  Renal/GU negative Renal ROS  negative genitourinary   Musculoskeletal negative musculoskeletal ROS (+)   Abdominal   Peds negative pediatric ROS (+)  Hematology negative hematology ROS (+)   Anesthesia Other Findings   Reproductive/Obstetrics negative OB ROS                             Anesthesia Physical Anesthesia Plan  ASA: II  Anesthesia Plan: General   Post-op Pain Management:    Induction: Intravenous  PONV Risk Score and Plan: 2 and Ondansetron, Dexamethasone and Treatment may vary due to age or medical condition  Airway Management Planned: LMA  Additional Equipment:   Intra-op Plan:   Post-operative Plan: Extubation in OR  Informed Consent: I have reviewed the patients History and Physical, chart, labs and discussed the procedure including the risks, benefits and alternatives for the proposed anesthesia with the patient or authorized representative who has indicated his/her understanding and acceptance.     Dental advisory given  Plan Discussed with: CRNA and Surgeon  Anesthesia Plan Comments:         Anesthesia Quick Evaluation  

## 2019-11-29 NOTE — Op Note (Signed)
Surgery Date: 11/29/2019  Surgeon(s): Yolonda Kida, MD  ANESTHESIA:  general  FLUIDS: Per anesthesia record.   ESTIMATED BLOOD LOSS: minimal  PREOPERATIVE DIAGNOSES:  1. Right knee medial. meniscus tear 2.  knee synovitis  POSTOPERATIVE DIAGNOSES:  same  PROCEDURES PERFORMED:  1. Right knee arthroscopy with arthroscopic partial medial meniscectomy   DESCRIPTION OF PROCEDURE: Philip Dorsey is a 49 y.o.-year-old male with right knee medial meniscus tear. Plans are to proceed with partial medial meniscectomy and diagnostic arthroscopy with debridement as indicated. Full discussion held regarding risks benefits alternatives and complications related surgical intervention. Conservative care options reviewed. All questions answered.  The patient was identified in the preoperative holding area and the operative extremity was marked. The patient was brought to the operating room and transferred to operating table in a supine position. Satisfactory general anesthesia was induced by anesthesiology.    Standard anterolateral, anteromedial arthroscopy portals were obtained. The anteromedial portal was obtained with a spinal needle for localization under direct visualization with subsequent diagnostic findings.   Anteromedial and anterolateral chambers: mild synovitis. The synovitis was debrided with a 4.5 mm full radius shaver through both the anteromedial and lateral portals.   Suprapatellar pouch and gutters: no synovitis or debris. Patella chondral surface: Grade 0 Trochlear chondral surface: Grade 0 Patellofemoral tracking: level Medial meniscus: horizontal tearing of the midbody to the posterior horn.  At the level of MCL there was a flap tear into the medial gutter and along the medial tibia.  Medial femoral condyle flexion bearing surface: Grade 0 Medial femoral condyle extension bearing surface: Grade 0 Medial tibial plateau: Grade 0 Anterior cruciate ligament:stable Posterior  cruciate ligament:stable Lateral meniscus: intact.   Lateral femoral condyle flexion bearing surface: Grade 0 Lateral femoral condyle extension bearing surface: Grade 0 Lateral tibial plateau: Grade 2  Medial meniscectomy was carried out at the mid body and posterior horn with combination of shaver and biters.  The inferior leaflet of the horizontal tear was completely resected.  After completion of synovectomy, diagnostic exam, and debridements as described, all compartments were checked and no residual debris remained. Hemostasis was achieved with the cautery wand. The portals were approximated with buried monocryl. All excess fluid was expressed from the joint.  Xeroform sterile gauze dressings were applied followed by Ace bandage and ice pack.   DISPOSITION: The patient was awakened from general anesthetic, extubated, taken to the recovery room in medically stable condition, no apparent complications. The patient may be weightbearing as tolerated to the operative lower extremity.  Range of motion of right knee as tolerated.

## 2020-04-11 IMAGING — US US SCROTUM W/ DOPPLER COMPLETE
1 series · 13 of 25 positions shown · non-contrast
Comparison: None.

CLINICAL DATA: 47-year-old male with left testicular pain for the
past 8-9 months. History of prior vasectomy.

EXAM:
SCROTAL ULTRASOUND
DOPPLER ULTRASOUND OF THE TESTICLES
TECHNIQUE: Complete ultrasound examination of the testicles, epididymis, and
other scrotal structures was performed. Color and spectral Doppler
ultrasound were also utilized to evaluate blood flow to the
testicles.

[Series 1: us scrotum w/ doppler complete · 0.06mm/px · 88 acquisitions, 13 frames shown]
[im 1/88]
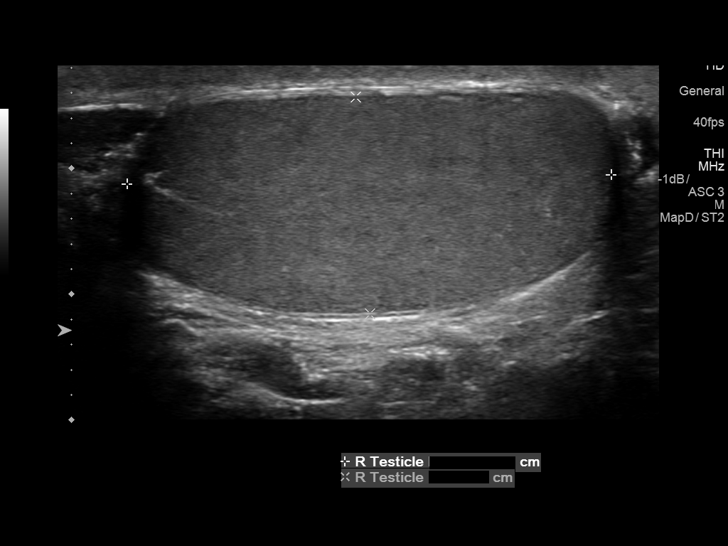
[im 8/88]
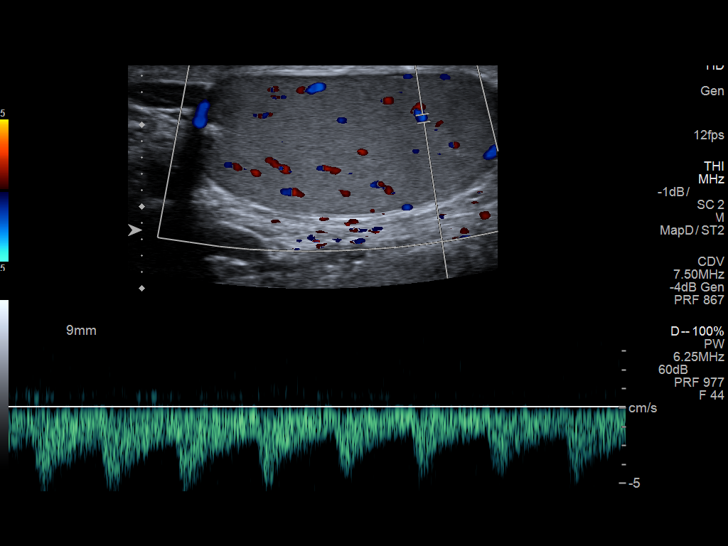
[im 15/88]
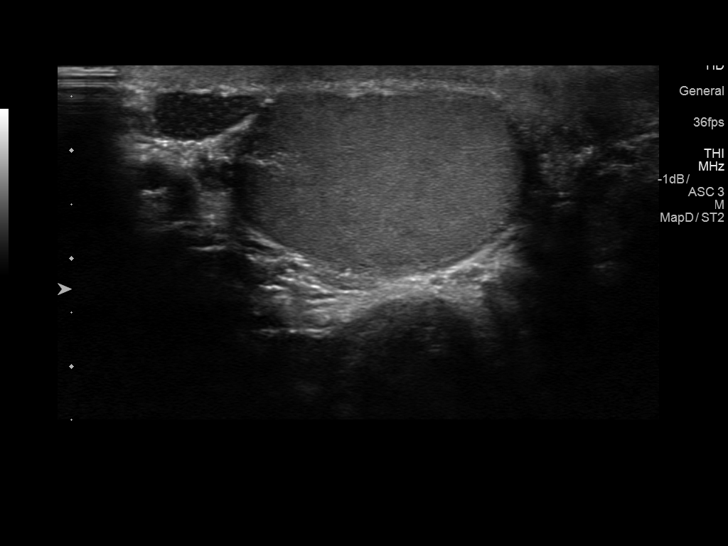
[im 22/88]
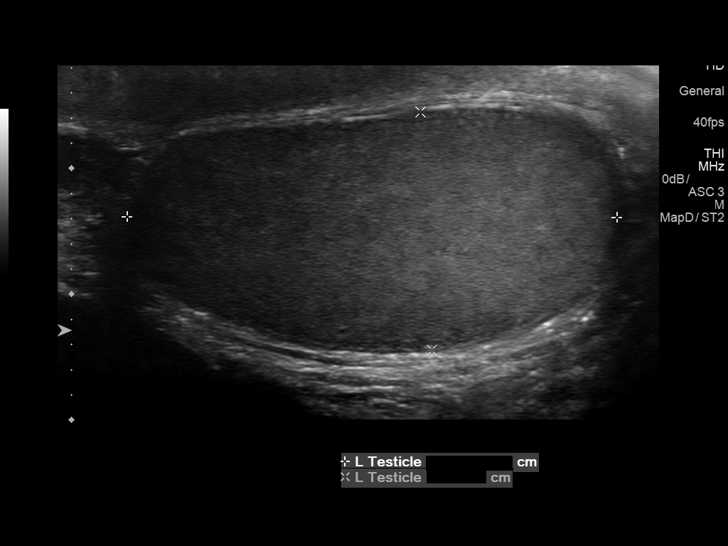
[im 30/88]
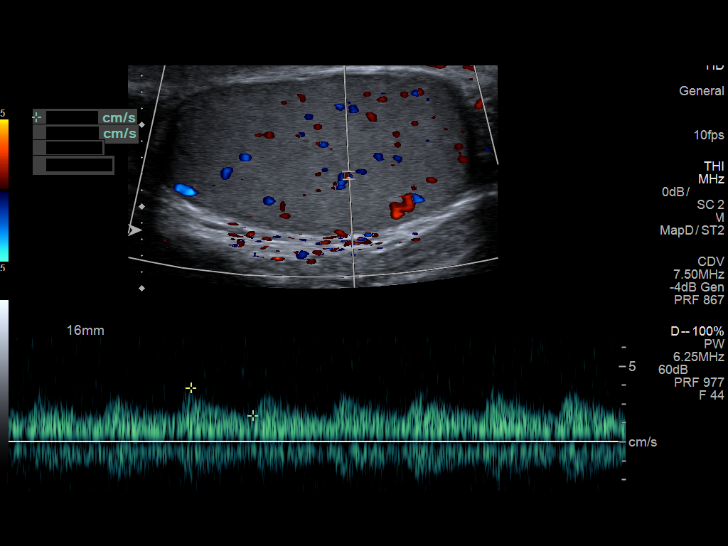
[im 37/88]
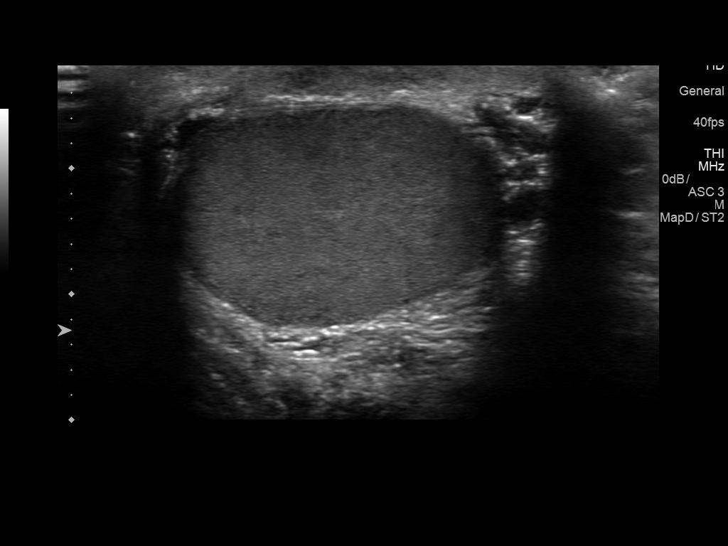
[im 44/88]
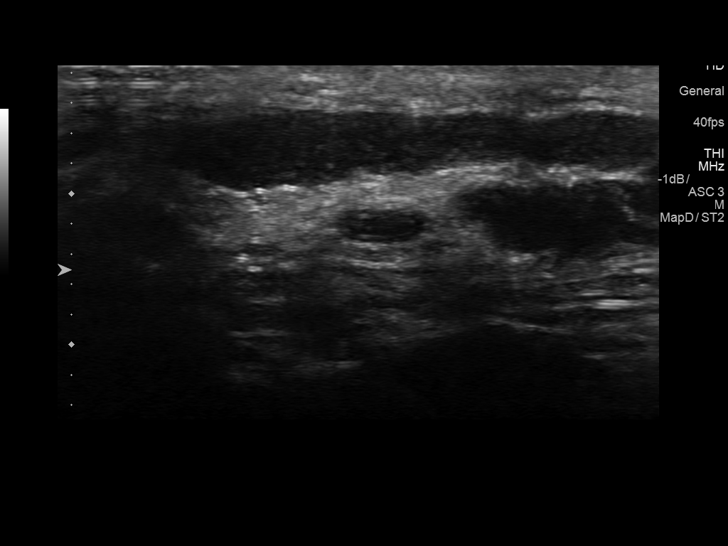
[im 51/88]
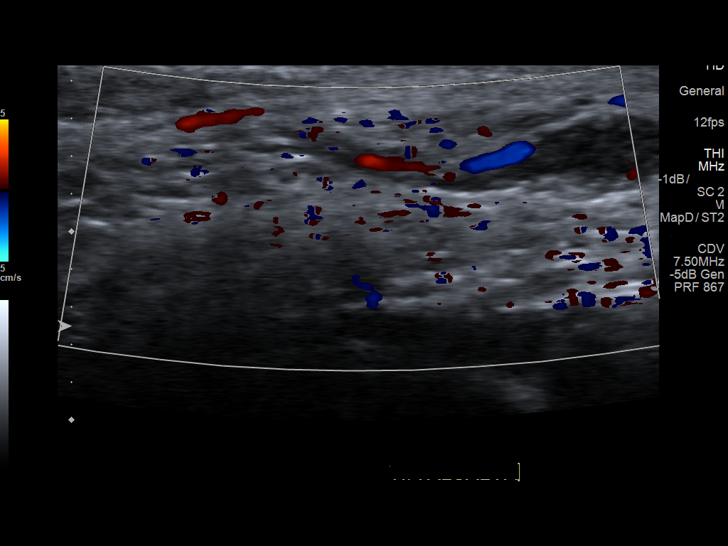
[im 59/88]
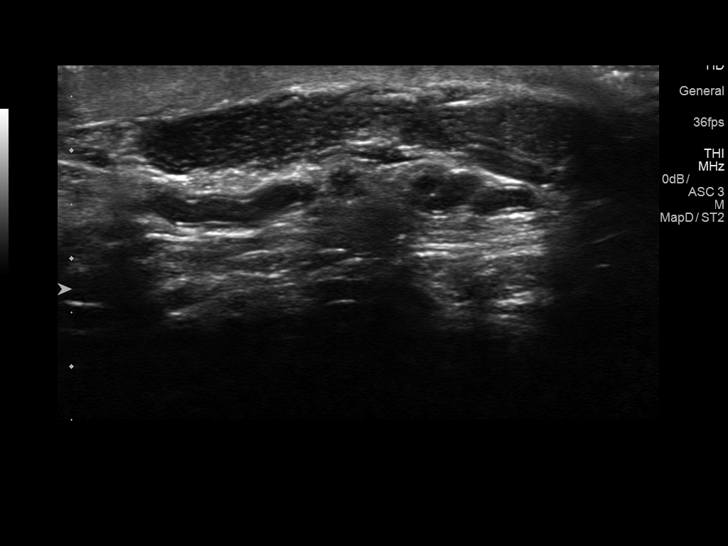
[im 66/88]
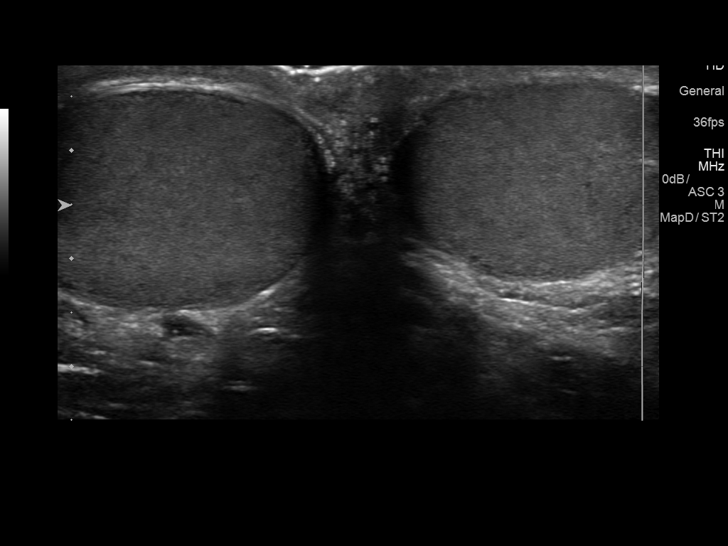
[im 73/88]
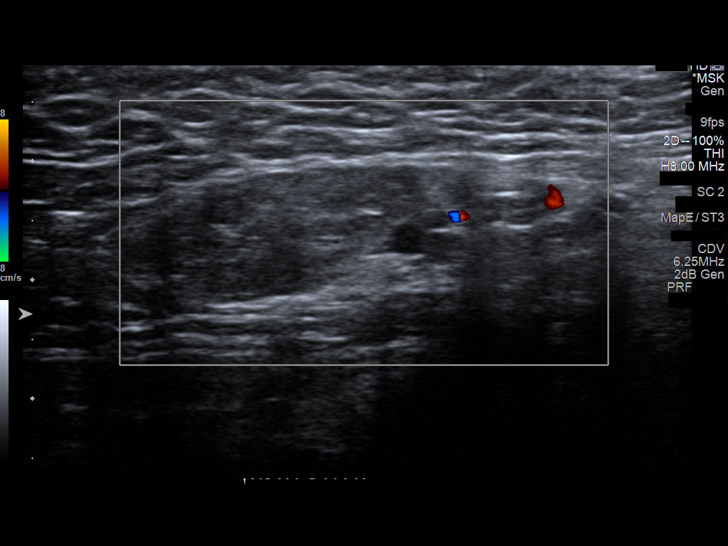
[im 80/88]
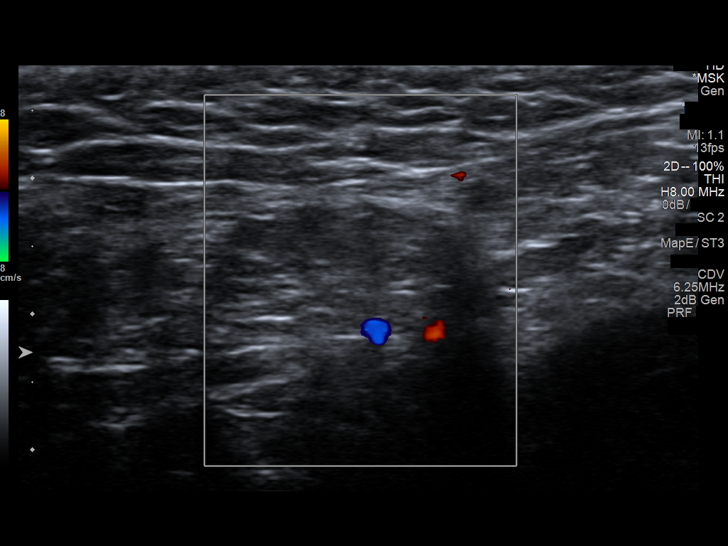
[im 88/88]
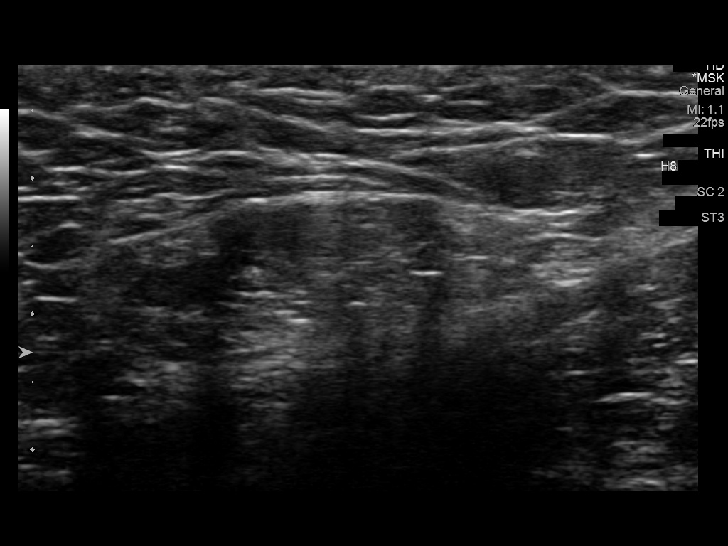

[13 of 25 positions shown; findings below may reference images not displayed]

FINDINGS: Right testicle

Measurements: 3.9 x 1.7 x 2.9 cm. No mass or microlithiasis
visualized.

Left testicle

Measurements: 3.9 x 1.9 x 3.0 cm. No mass or microlithiasis
visualized.

Right epididymis:  Normal in size and appearance.

Left epididymis:  Normal in size and appearance.

Hydrocele:  None visualized.

Varicocele:  None visualized.

Pulsed Doppler interrogation of both testes demonstrates normal low
resistance arterial and venous waveforms bilaterally.

Other: Adipose tissue extends from the abdominal cavity into the
left inguinal canal consistent with a fat containing inguinal
hernia. The hernia appears to be adjacent to the inferior epigastric
vessels.
IMPRESSION: 1. Normal testicular ultrasound. No evidence of torsion, mass or
other acute abnormality.
2. Omental fat containing left inguinal hernia. This may represent
the source of the patient's clinical symptoms.

## 2022-08-10 ENCOUNTER — Ambulatory Visit: Payer: Managed Care, Other (non HMO) | Admitting: Podiatry

## 2022-08-10 ENCOUNTER — Ambulatory Visit: Payer: Managed Care, Other (non HMO)

## 2022-08-10 DIAGNOSIS — M7751 Other enthesopathy of right foot: Secondary | ICD-10-CM

## 2022-08-10 DIAGNOSIS — M79671 Pain in right foot: Secondary | ICD-10-CM

## 2022-08-10 MED ORDER — BETAMETHASONE SOD PHOS & ACET 6 (3-3) MG/ML IJ SUSP
3.0000 mg | Freq: Once | INTRAMUSCULAR | Status: AC
  Administered 2022-08-10: 3 mg via INTRA_ARTICULAR

## 2022-08-10 MED ORDER — METHYLPREDNISOLONE 4 MG PO TBPK: ORAL_TABLET | ORAL | 0 refills | Status: DC

## 2022-08-10 NOTE — Progress Notes (Signed)
   Chief Complaint  Patient presents with   Foot Pain    Pain located in the right foot, ball under 2nd digit and radiates across the ball of the foot, pressure activates pain     HPI: 52 y.o. male presenting today as a reestablish new patient for evaluation of pain and tenderness associated to the second toe joint of the right foot.  Denies a history of injury.  He says that it is very painful on a daily basis with walking.  This has been present for about 6 weeks now.  He has not done anything for treatment  Past Medical History:  Diagnosis Date   Abnormal EKG 2011   RIGHT BBB, STRESS TEST DONE RESULTS NORMAL-13-2011 EPIC   Acute lateral meniscus tear of right knee    History of nuclear stress test 02/2010   bruce myoview; normal pattern of perfusion, no signficant ischemia   Hyperlipidemia    Loose body of right knee    Sleep apnea    mild no cpap needed    Past Surgical History:  Procedure Laterality Date   BACK SURGERY  2018   L 4 TO L5 DR Juanda Crumble [facility]    KNEE ARTHROSCOPY Left 05/26/2006   KNEE ARTHROSCOPY  12/02/2011   Procedure: ARTHROSCOPY KNEE;  Surgeon: Magnus Sinning, MD;  Location: [facility];  Service: Orthopedics;  Laterality: Right;     KNEE ARTHROSCOPY WITH MEDIAL MENISECTOMY Right 11/29/2019   Procedure: KNEE ARTHROSCOPY WITH PARTIAL MEDIAL MENISECTOMY;  Surgeon: Nicholes Stairs, MD;  Location[facility];  Service: Orthopedics;  Laterality: Right;   LAPAROSCOPIC CHOLECYSTECTOMY  12/11/2009   LUMBAR DISC SURGERY  1997 x 2   L4   NASAL SINUS SURGERY     right knee arthrosocpy  2020   [facility]    Allergies  Allergen Reactions   Amoxicillin Other (See Comments)    Itchy throat, felt like a knot in throat      Physical Exam: General: The patient is alert and oriented x3 in no acute distress.  Dermatology: Skin is warm, dry and supple bilateral lower extremities. Negative for open lesions or  macerations.  Vascular: Palpable pedal pulses bilaterally. Capillary refill within normal limits.  Negative for any significant edema or erythema  Neurological: Light touch and protective threshold grossly intact  Musculoskeletal Exam: No pedal deformities noted.  Pain on palpation range of motion of the second MTP right foot  Radiographic Exam RT foot 08/10/2022:  Normal osseous mineralization. Joint spaces preserved. No fracture/dislocation/boney destruction.    Assessment: 1.  Second MTP capsulitis right -Patient evaluated.  X-rays reviewed. -Injection of 0.5 cc Celestone Soluspan injection second MTP right -Prescription for Medrol Dosepak -Advised against going barefoot.  Recommend good supportive shoes and sneakers -Return to clinic as needed   Edrick Kins, DPM [facility]  Dr. Edrick Kins, DPM    [address], [place] [address]                Office [phone]  Fax [phone]

## 2022-09-15 ENCOUNTER — Other Ambulatory Visit: Payer: Self-pay | Admitting: Podiatry

## 2022-09-15 DIAGNOSIS — M7751 Other enthesopathy of right foot: Secondary | ICD-10-CM

## 2022-09-15 DIAGNOSIS — M79671 Pain in right foot: Secondary | ICD-10-CM

## 2022-10-17 ENCOUNTER — Ambulatory Visit (INDEPENDENT_AMBULATORY_CARE_PROVIDER_SITE_OTHER): Payer: 59 | Admitting: Podiatry

## 2022-10-17 ENCOUNTER — Encounter: Payer: Self-pay | Admitting: Podiatry

## 2022-10-17 DIAGNOSIS — M722 Plantar fascial fibromatosis: Secondary | ICD-10-CM

## 2022-10-17 MED ORDER — DICLOFENAC SODIUM 75 MG PO TBEC: 75.0000 mg | DELAYED_RELEASE_TABLET | Freq: Two times a day (BID) | ORAL | 2 refills | Status: DC

## 2022-10-17 NOTE — Patient Instructions (Signed)

## 2022-10-17 NOTE — Progress Notes (Signed)
Subjective:   Patient ID: Philip Dorsey, male   DOB: 52 y.o.   MRN: [id number]   HPI Patient has developed acute pain in the right plantar heel states it has been very sore over the last week and not sure what he may have done   ROS      Objective:  Physical Exam  Neurovascular status intact exquisite discomfort in the medial fascial band right and slightly distal with inflammation fluid buildup noted with moderate issues as far as arch structure     Assessment:  Acute inflammation plantar heel right     Plan:  H&P reviewed condition sterile prep injected the fascia right 3 mg Kenalog 5 mg Xylocaine applied fascial brace to lift up the arch which was fitted appropriately placed on oral anti-inflammatory and utilize good shoes and physical therapy which she is given today.  Reappoint as symptoms indicate

## 2022-10-18 ENCOUNTER — Telehealth: Payer: Self-pay | Admitting: Podiatry

## 2022-10-18 MED ORDER — DICLOFENAC SODIUM 75 MG PO TBEC: 75.0000 mg | DELAYED_RELEASE_TABLET | Freq: Two times a day (BID) | ORAL | 0 refills | Status: DC

## 2022-10-18 NOTE — Telephone Encounter (Signed)
Pt called and stated his medication was sent to the wrong pharmacy. I have changed the pharmacy in pts chart to [facility] in [place]. Could the medication be resent please.

## 2022-10-18 NOTE — Telephone Encounter (Signed)
Notified pt medication was sent to correct pharmacy. He said thank you

## 2022-10-19 ENCOUNTER — Ambulatory Visit: Payer: 59 | Admitting: Podiatry

## 2022-11-05 ENCOUNTER — Other Ambulatory Visit: Payer: Self-pay | Admitting: Podiatry

## 2022-11-07 NOTE — Telephone Encounter (Signed)
Please advise 

## 2022-11-23 ENCOUNTER — Ambulatory Visit (INDEPENDENT_AMBULATORY_CARE_PROVIDER_SITE_OTHER): Payer: 59 | Admitting: Podiatry

## 2022-11-23 ENCOUNTER — Encounter: Payer: Self-pay | Admitting: Podiatry

## 2022-11-23 DIAGNOSIS — M722 Plantar fascial fibromatosis: Secondary | ICD-10-CM | POA: Diagnosis not present

## 2022-11-23 MED ORDER — TRIAMCINOLONE ACETONIDE 10 MG/ML IJ SUSP
10.0000 mg | Freq: Once | INTRAMUSCULAR | Status: AC
  Administered 2022-11-23: 10 mg

## 2022-11-23 NOTE — Progress Notes (Signed)
Subjective:   Patient ID: Philip Dorsey, male   DOB: 52 y.o.   MRN: [id number]   HPI Patient states that his right heel is very sore again and the only had several weeks of relief.  States that he has to be on his foot at all times at work and he can walk up to 20 25,000 steps per day   ROS      Objective:  Physical Exam  Neurovascular status was found to be intact inflammation distal to the insertion of the fascia into the calcaneus right fluid buildup and erythema around the area no drainage.       Assessment:  Acute fasciitis right not responding so far conservatively     Plan:  H&P reviewed and discussed the amount he needs to be on his foot.  At this point I did do sterile prep I did reinject the fascia distal to the insertion calcaneus 3 mg Kenalog 5 mg Xylocaine and applied an air fracture walker to completely immobilize which he states he can wear at work.  Continue oral anti-inflammatories reappoint 1 month

## 2022-11-25 ENCOUNTER — Ambulatory Visit: Payer: 59 | Admitting: Podiatry

## 2022-11-26 ENCOUNTER — Other Ambulatory Visit: Payer: Self-pay | Admitting: Podiatry

## 2022-12-21 ENCOUNTER — Encounter: Payer: Self-pay | Admitting: Podiatry

## 2022-12-21 ENCOUNTER — Ambulatory Visit (INDEPENDENT_AMBULATORY_CARE_PROVIDER_SITE_OTHER): Payer: 59 | Admitting: Podiatry

## 2022-12-21 DIAGNOSIS — M722 Plantar fascial fibromatosis: Secondary | ICD-10-CM | POA: Diagnosis not present

## 2022-12-21 NOTE — Progress Notes (Signed)
Subjective:   Patient ID: Philip Dorsey, male   DOB: 52 y.o.   MRN: [id number]   HPI Patient presents that overall his heel has gotten much better with new shoes and on the lateral outside of the foot the there is an area that is numb but it seems to also be improving   ROS      Objective:  Physical Exam  Neuro vas scaler status intact with heel that is dramatically improved minimal discomfort noted with the lateral side of the foot showing mild numbness localized to the area     Assessment:  Acute fasciitis right improved with conservative treatment immobilization and probability for nerve irritation the lateral side foot     Plan:  Reviewed conditions recommended increase in activity continue wearing shoes at all time reappoint to recheck

## 2023-02-06 ENCOUNTER — Other Ambulatory Visit: Payer: Self-pay | Admitting: Chiropractic Medicine

## 2023-02-06 DIAGNOSIS — M545 Low back pain, unspecified: Secondary | ICD-10-CM

## 2023-02-25 ENCOUNTER — Ambulatory Visit
Admission: RE | Admit: 2023-02-25 | Discharge: 2023-02-25 | Disposition: A | Discharge status: Home / Self Care | Payer: 59 | Source: Ambulatory Visit | Attending: Chiropractic Medicine | Admitting: Chiropractic Medicine

## 2023-02-25 DIAGNOSIS — M545 Low back pain, unspecified: Secondary | ICD-10-CM

## 2023-02-25 MED ORDER — GADOPICLENOL 0.5 MMOL/ML IV SOLN
9.0000 mL | Freq: Once | INTRAVENOUS | Status: AC | PRN
  Administered 2023-02-25: 9 mL via INTRAVENOUS

## 2023-09-22 ENCOUNTER — Other Ambulatory Visit: Payer: Self-pay | Admitting: Neurosurgery

## 2023-09-22 DIAGNOSIS — G8929 Other chronic pain: Secondary | ICD-10-CM

## 2023-10-04 NOTE — Discharge Instructions (Signed)

## 2023-10-05 ENCOUNTER — Ambulatory Visit
Admission: RE | Admit: 2023-10-05 | Discharge: 2023-10-05 | Disposition: A | Discharge status: Home / Self Care | Payer: 59 | Source: Ambulatory Visit | Attending: Neurosurgery | Admitting: Neurosurgery

## 2023-10-05 ENCOUNTER — Ambulatory Visit
Admission: RE | Admit: 2023-10-05 | Discharge: 2023-10-05 | Disposition: A | Discharge status: Home / Self Care | Payer: Managed Care, Other (non HMO) | Source: Ambulatory Visit | Attending: Neurosurgery | Admitting: Neurosurgery

## 2023-10-05 DIAGNOSIS — G8929 Other chronic pain: Secondary | ICD-10-CM

## 2023-10-05 MED ORDER — DIAZEPAM 5 MG PO TABS: 10.0000 mg | ORAL_TABLET | Freq: Once | ORAL | Status: DC

## 2023-10-05 MED ORDER — ONDANSETRON HCL 4 MG/2ML IJ SOLN: 4.0000 mg | Freq: Once | INTRAMUSCULAR | Status: DC | PRN

## 2023-10-05 MED ORDER — IOPAMIDOL (ISOVUE-M 200) INJECTION 41%
20.0000 mL | Freq: Once | INTRAMUSCULAR | Status: AC
  Administered 2023-10-05: 20 mL via INTRATHECAL

## 2023-10-05 MED ORDER — MEPERIDINE HCL 50 MG/ML IJ SOLN: 50.0000 mg | Freq: Once | INTRAMUSCULAR | Status: DC | PRN

## 2024-02-01 ENCOUNTER — Encounter: Payer: Self-pay | Admitting: Podiatry

## 2024-02-01 ENCOUNTER — Ambulatory Visit: Admitting: Podiatry

## 2024-02-01 ENCOUNTER — Ambulatory Visit (INDEPENDENT_AMBULATORY_CARE_PROVIDER_SITE_OTHER)

## 2024-02-01 DIAGNOSIS — M7751 Other enthesopathy of right foot: Secondary | ICD-10-CM

## 2024-02-01 MED ORDER — TRIAMCINOLONE ACETONIDE 10 MG/ML IJ SUSP
10.0000 mg | Freq: Once | INTRAMUSCULAR | Status: AC
  Administered 2024-02-01: 10 mg via INTRA_ARTICULAR

## 2024-02-02 NOTE — Progress Notes (Signed)
 Subjective:   Patient ID: Philip Dorsey, male   DOB: 53 y.o.   MRN: [id number]   HPI Patient presents with a lot of pain in the outside of the right foot and states he does not remember specific injury that occurred   ROS      Objective:  Physical Exam  Neurovascular status intact with inflammation pain of the base the fifth metatarsal right fluid buildup around the area with no loss of tendon function or loss of muscle strength     Assessment:  Acute peroneal tendinitis base of fifth metatarsal right with fluid buildup     Plan:  H&P reviewed discussed condition and x-ray.  Sterile prep injected the tendon sheath to the base 3 mg dexamethasone  Kenalog  5 mg Xylocaine  after explaining risk associated with injection.  I then went ahead and advised on wider shoe gear  X-rays indicate that there is no signs currently of muscle or tendon or bone damage around the area appears to be strictly soft tissue

## 2024-06-21 ENCOUNTER — Encounter: Payer: Self-pay | Admitting: Podiatry

## 2024-06-21 ENCOUNTER — Ambulatory Visit: Admitting: Podiatry

## 2024-06-21 DIAGNOSIS — M7671 Peroneal tendinitis, right leg: Secondary | ICD-10-CM

## 2024-06-21 MED ORDER — TRIAMCINOLONE ACETONIDE 10 MG/ML IJ SUSP
10.0000 mg | Freq: Once | INTRAMUSCULAR | Status: AC
  Administered 2024-06-21: 10 mg via INTRA_ARTICULAR

## 2024-06-24 NOTE — Progress Notes (Signed)
 Subjective:   Patient ID: Philip Dorsey, male   DOB: 53 y.o.   MRN: [id number]   HPI Patient states he did well for a number of months but has developed pain again on the outside of his right foot and is wanting to consider MRI   ROS      Objective:  Physical Exam  Neurovascular status intact he did get better for a number of months this just happened recently so I want to try conservative again but I did explain to him the possibility for MRI or other condition.  It did not completely improve so I do not think it is torn I think it is inflamed but I explained that and he agrees     Assessment:  Peroneal tendinitis right with inflammation fluid noted     Plan:  H&P reviewed at this point sterile prep did inject the peroneal tendon insertion 3 mg dexamethasone  Kenalog  5 mg Xylocaine  after explaining risk and I did apply fascial brace to lift up the lateral side of the foot.  Reappoint to recheck in the next 4 weeks
